# Patient Record
Sex: Female | Born: 1935 | Race: White | Hispanic: No | Marital: Single | Smoking: Never smoker
Health system: Southern US, Community
[De-identification: ages and names within clinical notes are randomized; demographics above are authoritative.]

## PROBLEM LIST (undated history)

## (undated) DIAGNOSIS — E785 Hyperlipidemia, unspecified: Secondary | ICD-10-CM

## (undated) DIAGNOSIS — Z9289 Personal history of other medical treatment: Secondary | ICD-10-CM

## (undated) DIAGNOSIS — K5790 Diverticulosis of intestine, part unspecified, without perforation or abscess without bleeding: Secondary | ICD-10-CM

## (undated) DIAGNOSIS — I10 Essential (primary) hypertension: Secondary | ICD-10-CM

## (undated) DIAGNOSIS — F09 Unspecified mental disorder due to known physiological condition: Secondary | ICD-10-CM

## (undated) HISTORY — DX: Personal history of other medical treatment: Z92.89

## (undated) HISTORY — DX: Hyperlipidemia, unspecified: E78.5

## (undated) HISTORY — PX: CATARACT EXTRACTION: SUR2

## (undated) HISTORY — DX: Diverticulosis of intestine, part unspecified, without perforation or abscess without bleeding: K57.90

## (undated) HISTORY — DX: Unspecified mental disorder due to known physiological condition: F09

## (undated) HISTORY — DX: Essential (primary) hypertension: I10

---

## 1999-11-28 HISTORY — PX: CARDIAC CATHETERIZATION: SHX172

## 2000-03-30 ENCOUNTER — Encounter: Admission: RE | Admit: 2000-03-30 | Discharge: 2000-03-30 | Payer: Self-pay | Admitting: Family Medicine

## 2000-03-30 ENCOUNTER — Encounter: Payer: Self-pay | Admitting: Family Medicine

## 2000-06-29 ENCOUNTER — Encounter: Admission: RE | Admit: 2000-06-29 | Discharge: 2000-06-29 | Payer: Self-pay | Admitting: Cardiology

## 2000-06-29 ENCOUNTER — Encounter: Payer: Self-pay | Admitting: Cardiology

## 2000-07-02 ENCOUNTER — Ambulatory Visit (HOSPITAL_COMMUNITY): Admission: RE | Admit: 2000-07-02 | Discharge: 2000-07-02 | Payer: Self-pay | Admitting: Cardiology

## 2001-03-01 ENCOUNTER — Other Ambulatory Visit: Admission: RE | Admit: 2001-03-01 | Discharge: 2001-03-01 | Payer: Self-pay | Admitting: Family Medicine

## 2001-12-16 ENCOUNTER — Encounter: Payer: Self-pay | Admitting: Orthopedic Surgery

## 2001-12-16 ENCOUNTER — Encounter: Admission: RE | Admit: 2001-12-16 | Discharge: 2001-12-16 | Payer: Self-pay | Admitting: Orthopedic Surgery

## 2001-12-17 ENCOUNTER — Encounter (INDEPENDENT_AMBULATORY_CARE_PROVIDER_SITE_OTHER): Payer: Self-pay | Admitting: Specialist

## 2001-12-17 ENCOUNTER — Ambulatory Visit (HOSPITAL_BASED_OUTPATIENT_CLINIC_OR_DEPARTMENT_OTHER): Admission: RE | Admit: 2001-12-17 | Discharge: 2001-12-17 | Payer: Self-pay | Admitting: Orthopedic Surgery

## 2003-02-26 ENCOUNTER — Ambulatory Visit (HOSPITAL_COMMUNITY): Admission: RE | Admit: 2003-02-26 | Discharge: 2003-02-26 | Payer: Self-pay | Admitting: Ophthalmology

## 2003-02-26 HISTORY — PX: OTHER SURGICAL HISTORY: SHX169

## 2003-06-28 HISTORY — PX: POLYPECTOMY: SHX149

## 2003-07-08 ENCOUNTER — Encounter (INDEPENDENT_AMBULATORY_CARE_PROVIDER_SITE_OTHER): Payer: Self-pay | Admitting: Specialist

## 2003-07-08 ENCOUNTER — Ambulatory Visit (HOSPITAL_COMMUNITY): Admission: RE | Admit: 2003-07-08 | Discharge: 2003-07-08 | Payer: Self-pay | Admitting: Gastroenterology

## 2003-07-08 LAB — HM COLONOSCOPY

## 2004-11-09 ENCOUNTER — Encounter: Admission: RE | Admit: 2004-11-09 | Discharge: 2004-11-09 | Payer: Self-pay | Admitting: Family Medicine

## 2005-01-13 ENCOUNTER — Encounter (INDEPENDENT_AMBULATORY_CARE_PROVIDER_SITE_OTHER): Payer: Self-pay | Admitting: *Deleted

## 2005-01-13 ENCOUNTER — Ambulatory Visit (HOSPITAL_COMMUNITY): Admission: RE | Admit: 2005-01-13 | Discharge: 2005-01-13 | Payer: Self-pay | Admitting: Obstetrics and Gynecology

## 2006-11-23 DIAGNOSIS — Z9289 Personal history of other medical treatment: Secondary | ICD-10-CM

## 2006-11-23 HISTORY — DX: Personal history of other medical treatment: Z92.89

## 2007-12-30 DIAGNOSIS — Z9289 Personal history of other medical treatment: Secondary | ICD-10-CM

## 2007-12-30 HISTORY — DX: Personal history of other medical treatment: Z92.89

## 2010-02-16 ENCOUNTER — Encounter: Admission: RE | Admit: 2010-02-16 | Discharge: 2010-05-17 | Payer: Self-pay | Admitting: Family Medicine

## 2011-04-14 NOTE — Op Note (Signed)
NAME:  Sabrina [name], Sabrina [name] NO.:  [id number]   MEDICAL RECORD NO.:  [id number]          PATIENT TYPE:  AMB   LOCATION:  SDC                           FACILITY:  [facility]   PHYSICIAN:  Randye Lobo, M.D.   DATE OF BIRTH:  [birth year]   DATE OF PROCEDURE:  01/13/2005  DATE OF DISCHARGE:                                 OPERATIVE REPORT   PREOPERATIVE DIAGNOSIS:  Endometrial polyp.   POSTOPERATIVE DIAGNOSIS:  Endometrial polyp.   PROCEDURE:  Hysteroscopy, dilatation and curettage, polypectomy of the  endometrium.   SURGEON:  Randye Lobo, M.D.   ANESTHESIA:  General endotracheal.   IV FLUIDS:  600 cc of lactated Ringer's.   ESTIMATED BLOOD LOSS:  Minimal.   URINE OUTPUT:  75 cc.   COMPLICATIONS:  None.   INDICATIONS FOR PROCEDURE:  The patient is a 77 year old gravida 2, para 2  Caucasian female who presented for evaluation of a thickened endometrium  noted on an MRI which was ordered for evaluation of the lower spine.  The  patient had further evaluation of this by Dr. Mosetta Putt who ordered a  pelvic ultrasound documenting an endometrial thickness of 15.5 mm.  The  myometrium and the ovaries were noted to be unremarkable.  The patient then  presented for further gynecologic evaluation and had an office endometrial  biopsy documenting polypoid benign endometrium.  The patient has had one  episode of postmenopausal bleeding, and she is not taking any hormone  therapy.   A discussion was held with the patient regarding the findings, and a  recommendation was made to proceed with a hysteroscopy with dilatation and  curettage and removal of the endometrial polyp.  The patient chose to  proceed after the risks, benefits, and alternatives were discussed with her.   FINDINGS:  Examination under anesthesia revealed a small anteverted uterus  with no adnexal masses.   Hysteroscopy demonstrated an endometrial polyp which measured approximately  2 cm in diameter.   It appeared to be very fluid-filled and almost cystic in  nature.  There was no evidence of any submucosal fibroids.   SPECIMENS:  The endometrial polyp and the endometrial curettings were sent  to pathology together.   DESCRIPTION OF PROCEDURE:  The patient was reidentified in the preoperative  holding area.  She received clindamycin 900 mg intravenously for antibiotic  prophylaxis.  The patient was taken to the operating room where general  endotracheal anesthesia was induced, and she was then placed in the dorsal  lithotomy position.  The vagina was then sterilely prepped and draped, and  the bladder was emptied of urine.   An examination under anesthesia was performed, and the findings are as noted  above.   A speculum was placed inside the vagina, and a single-tooth tenaculum was  placed on the anterior cervical lip.  The uterus was sounded to  approximately 7 cm.  The cervix was then sequentially dilated to a #19 Pratt  dilator.  The hysteroscope was then inserted into the uterine cavity under  the continuous infusion of Hiscon.  The findings  are as noted above.  The  hysteroscope was removed, and an attempt was made to remove the polyp with a  polyp forceps and then by curettage; however, this was unsuccessful.  The  cervix was then further dilated to a #25 Pratt dilator, and an attempt was  made to place the resectoscope through the cervix and into the endometrial  cavity.  There was some cervical stenosis that was encountered, and with  dilation of the patient's cervix, she developed bradycardia.  When the  dilation of the cervix was discontinued, the bradycardia would resolve.  A  decision was therefore made to again use a polyp forceps to attempt to  remove the endometrial polyp, and this was successful.  The diagnostic  hysteroscope was inserted one final time under continuous infusion of  sorbitol, and there was a small base of the polyp which was still remaining  along  the posterior mid-fundus.  The hysteroscope was removed, and a sharp  curettage in this area was performed.  This remaining small piece was  successfully removed.  All of the specimen was sent to pathology.  This  concluded the patient's hysteroscopy with D&C and polypectomy.   Hemostasis was good at the end of the procedure.  All instruments were  removed, and the patient was cleansed of any remaining Betadine.  There were  no complications to the procedure.  All needle, instrument, and sponge  counts were correct.   There was a sorbitol deficit of 110 cc.   The patient was then escorted to the recovery room in stable and awake  condition.      BES/MEDQ  D:  01/13/2005  T:  01/13/2005  Job:  [id number]   cc:   Mosetta Putt, M.D.  [address] [place]  [place] [address]  Fax: [phone]

## 2011-04-14 NOTE — Cardiovascular Report (Signed)
[facility]  Patient:    Sabrina [name], Sabrina [name]                       MRN: [id number] Proc. Date: 07/02/00 Adm. Date:  [id number] Attending:  Loreli Dollar CC:         Carolyne Fiscal, M.D.                        Cardiac Catheterization  INDICATIONS FOR PROCEDURE:  Ms. Melcher is a 77 year old female who has had frequent PVCs and nonsustained, asymptomatic, ventricular tachycardia.  She came to the office with her daughters who complained that their mother was so fatigued that she could not walk through the store without having to rest several times.  Ms. Bittinger denied any chest pain.  A nuclear study was performed that showed anterolateral ischemia.  PROCEDURES: 1. Left heart catheterization. 2. Selective right and left coronary arteriography. 3. Ventriculography x 2 in the RAO projection.  COMPLICATIONS:  None.  DESCRIPTION OF PROCEDURE:  The patient was prepped and draped in the usual sterile fashion exposing the right groin.  After applying local anesthetic with 1% Xylocaine, the Seldinger technique was deployed, and a #6 [place] introducer sheath was placed in the right femoral artery.  Selective right and left coronary arteriography and ventriculography in the RAO projection was performed.  With the first ventriculogram, the capsule was in the mitral apparatus.  It was moved to the apex of the heart for the second ventriculogram.  Then #6 French Judkins configuration catheters were used.  RESULTS: 1. Hemodynamic monitoring: The central aortic pressure was 172/72, and left    ventricular pressure was 172/22 with no aortic valve gradient noted at the    time of pullback. 2. Ventriculography: Ventriculography in the RAO projection revealed left    ventricular hypertrophy with a very heavily trabeculated left ventricle.    The posterior basilar segment was hyperdynamic, and there was a very large,    papillary muscle noted.  There was no  ______.  Ejection fraction was 65%,    and the end diastolic pressure was 22.  CORONARY ARTERIOGRAPHY:  There was calcification in the proximal LAD on fluoroscopy. 1. Left main: Normal. 2. LAD: The LAD extended down and across the apex of the heart.  It gave rise    to two small diagonal branches.  In the more distal portion of the mid LAD    was a long, tubular area of stenosis that was about 40 mm in length and    about 40-50% in stenosis.  The distal vessel was free of disease. 3. Circumflex: The circumflex gave rise to one large, ______ vessel.  This    system was free of disease. 4. Right coronary artery: The right coronary artery revealed a very large    vessel that had a large PDA and posterolateral branch, all of which were    free of disease.  CONCLUSIONS: 1. Mild coronary disease with 40% tubular narrowing and a long lesion in the    mid portion of the left anterior descending. 2. Hyperdynamic left ventricle with left ventricular hypertrophy.  DISCUSSION:  At this point, there is nothing that needs intervention.  She will be managed with medical therapy. DD:  07/02/00 TD:  07/02/00 Job: [id number]

## 2011-08-09 HISTORY — PX: TRANSTHORACIC ECHOCARDIOGRAM: SHX275

## 2011-10-17 LAB — HM PAP SMEAR: HM PAP: NORMAL

## 2013-06-30 ENCOUNTER — Encounter: Payer: Self-pay | Admitting: *Deleted

## 2013-07-03 ENCOUNTER — Ambulatory Visit (INDEPENDENT_AMBULATORY_CARE_PROVIDER_SITE_OTHER): Payer: PRIVATE HEALTH INSURANCE | Admitting: Internal Medicine

## 2013-07-03 ENCOUNTER — Encounter: Payer: Self-pay | Admitting: Internal Medicine

## 2013-07-03 VITALS — BP 152/58 | HR 67 | Ht 65.0 in | Wt 178.7 lb

## 2013-07-03 DIAGNOSIS — E119 Type 2 diabetes mellitus without complications: Secondary | ICD-10-CM

## 2013-07-03 DIAGNOSIS — E785 Hyperlipidemia, unspecified: Secondary | ICD-10-CM | POA: Insufficient documentation

## 2013-07-03 DIAGNOSIS — I493 Ventricular premature depolarization: Secondary | ICD-10-CM | POA: Insufficient documentation

## 2013-07-03 DIAGNOSIS — I10 Essential (primary) hypertension: Secondary | ICD-10-CM

## 2013-07-03 DIAGNOSIS — I4949 Other premature depolarization: Secondary | ICD-10-CM

## 2013-07-03 NOTE — Patient Instructions (Addendum)
Your physician wants you to follow-up in:  6 months. You will receive a reminder letter in the mail two months in advance. If you don't receive a letter, please call our office to schedule the follow-up appointment.   

## 2013-07-03 NOTE — Progress Notes (Signed)
OFFICE NOTE  Chief Complaint:  Routine followup  Primary Care Physician: Ailene Ravel, MD  HPI:  Sabrina [name] is a pleasant 79 year old female. She has had a history of asymptomatic nonsustained ventricular tachycardia. She has had a cardiac catheterization performed in 2001 that showed only LVH. She had a nuclear study in 2007 that was negative for ischemia. She continues to have no awareness of any arrhythmias.  She is unlimited from a physical standpoint. She has no ankle edema, no unusual breathlessness. She is diabetic and has a history of dyslipidemia and hypertension. Just has mild carotid artery disease. She had carotid Dopplers in 2009 that showed less than 50% narrowing on the left and none on the right. She had been seen by Dr. Adella Hare at the [facility] in [facility]. The concern with her family history of Alzheimer's is that she may be at risk for this. She has basically been on Namenda and Aricept for this. She does have some memory issues but according to her this is more preventative. I think she probably has some early issues with dementia. Otherwise she has no other complaints.  PMHx:  Past Medical History  Diagnosis Date  . Hypertension   . Diabetes mellitus without complication   . Hyperlipidemia   . PVC's (premature ventricular contractions)   . Cognitive disorder     early dementia?  . Diverticular disease     L colon  . History of nuclear stress test 11/23/2006    dipyridamole; normal pattern of perfusion; low risk scan   . History of Doppler ultrasound 12/30/2007    carotid doppler; L Mid ICA 0-49% diameter reduction, R & L subclavian artery 0-49% diameter reduction     Past Surgical History  Procedure Laterality Date  . Cardiac catheterization  2001    non occlusive disease, 40% stenosis of LAD; LVH  . Polypectomy  06/2003  . Eyelid surgery  02/26/2003    r/t dematochalsis of upper eyelids  . Transthoracic echocardiogram  08/09/2011   EF>55%; mild concentric LVH; LA mildly dilated; mild MV prolapse; trace MR, mild TR, trace AV regurg, trace PV regurg  . Cataract extraction  04/2013, 05/2013    x2    FAMHx:  Family History  Problem Relation Age of Onset  . Hypertension Mother   . Stroke Mother   . Heart disease Father   . Heart Problems Brother     MV prolapse  . Heart Problems Sister     MV prolapse  . Hypertension Sister     x2    SOCHx:   reports that she has never smoked. She has never used smokeless tobacco. She reports that she does not drink alcohol or use illicit drugs.  ALLERGIES:  Allergies  Allergen Reactions  . Codeine   . Penicillins     ROS: A comprehensive review of systems was negative except for: Cardiovascular: positive for palpitations Neurological: positive for memory problems  HOME MEDS: Current Outpatient Prescriptions  Medication Sig Dispense Refill  . Alendronate Sodium (FOSAMAX PO) Take by mouth once a week.      . ALPRAZolam (XANAX) 0.5 MG tablet Take 0.5 mg by mouth at bedtime as needed for sleep.      . Ascorbic Acid (VITAMIN C PO) Take by mouth daily.      Marland Kitchen aspirin 81 MG EC tablet Take 81 mg by mouth daily. Swallow whole.      . benazepril (LOTENSIN) 40 MG tablet Take 40 mg by mouth  daily.      . calcium carbonate (OS-CAL) 600 MG TABS tablet Take 600 mg by mouth 2 (two) times daily with a meal.      . Cyanocobalamin (VITAMIN B-12 PO) Take by mouth daily.      Marland Kitchen diltiazem (TIAZAC) 240 MG 24 hr capsule Take 240 mg by mouth daily.      Marland Kitchen donepezil (ARICEPT) 10 MG tablet Take 10 mg by mouth at bedtime.      Marland Kitchen doxazosin (CARDURA) 2 MG tablet Take 2 mg by mouth at bedtime.      . DUREZOL 0.05 % EMUL 2 (two) times daily.      . fish oil-omega-3 fatty acids 1000 MG capsule Take 4 g by mouth daily.      Marland Kitchen glipiZIDE (GLUCOTROL XL) 2.5 MG 24 hr tablet Take 1 tablet by mouth daily.      . hydrochlorothiazide (MICROZIDE) 12.5 MG capsule Take 12.5 mg by mouth daily.      . memantine  (NAMENDA) 10 MG tablet Take 15 mg by mouth daily.      . metoprolol succinate (TOPROL-XL) 25 MG 24 hr tablet Take 25 mg by mouth daily.      . pravastatin (PRAVACHOL) 20 MG tablet Take 20 mg by mouth daily.      Marland Kitchen PROLENSA 0.07 % SOLN daily.      . vitamin E (VITAMIN E) 400 UNIT capsule Take 400 Units by mouth daily.       No current facility-administered medications for this visit.    LABS/IMAGING: No results found for this or any previous visit (from the past 48 hour(s)). No results found.  VITALS: BP 152/58  Pulse 67  Ht 5\' 5"  (1.651 m)  Wt 178 lb 11.2 oz (81.058 kg)  BMI 29.74 kg/m2  EXAM: General appearance: alert and no distress Neck: no adenopathy, no carotid bruit, no JVD, supple, symmetrical, trachea midline and thyroid not enlarged, symmetric, no tenderness/mass/nodules Lungs: clear to auscultation bilaterally Heart: regular rate and rhythm, S1, S2 normal, no murmur, click, rub or gallop Abdomen: soft, non-tender; bowel sounds normal; no masses,  no organomegaly Extremities: extremities normal, atraumatic, no cyanosis or edema Pulses: 2+ and symmetric Skin: Skin color, texture, turgor normal. No rashes or lesions Neurologic: Grossly normal  EKG: Sinus rhythm with first degree AV block  ASSESSMENT: 1. Hypertension 2. Asymptomatic PVCs 3. Hyperlipidemia 4. Diabetes 5. Cognitive  PLAN: 1.  Sabrina [name] is doing very well. I recommend continuing her current medications we'll see her back annually or sooner as necessary.  [name], MD, [facility] Attending Cardiologist [facility]  Darcel Frane C 07/03/2013, 7:57 PM

## 2013-11-05 ENCOUNTER — Other Ambulatory Visit: Payer: Self-pay | Admitting: Internal Medicine

## 2013-11-05 NOTE — Telephone Encounter (Signed)
Rx was sent to pharmacy electronically. 

## 2013-12-05 ENCOUNTER — Other Ambulatory Visit: Payer: Self-pay | Admitting: Internal Medicine

## 2013-12-05 NOTE — Telephone Encounter (Signed)
Rx was sent to pharmacy electronically. 

## 2014-01-01 DIAGNOSIS — F0280 Dementia in other diseases classified elsewhere without behavioral disturbance: Secondary | ICD-10-CM | POA: Insufficient documentation

## 2014-01-01 DIAGNOSIS — G309 Alzheimer's disease, unspecified: Secondary | ICD-10-CM

## 2014-01-09 ENCOUNTER — Ambulatory Visit (INDEPENDENT_AMBULATORY_CARE_PROVIDER_SITE_OTHER): Payer: PRIVATE HEALTH INSURANCE | Admitting: Internal Medicine

## 2014-01-09 ENCOUNTER — Encounter: Payer: Self-pay | Admitting: Internal Medicine

## 2014-01-09 VITALS — BP 136/58 | HR 76 | Ht 66.0 in | Wt 177.9 lb

## 2014-01-09 DIAGNOSIS — I4949 Other premature depolarization: Secondary | ICD-10-CM

## 2014-01-09 DIAGNOSIS — I493 Ventricular premature depolarization: Secondary | ICD-10-CM

## 2014-01-09 DIAGNOSIS — E785 Hyperlipidemia, unspecified: Secondary | ICD-10-CM

## 2014-01-09 DIAGNOSIS — E119 Type 2 diabetes mellitus without complications: Secondary | ICD-10-CM

## 2014-01-09 DIAGNOSIS — I10 Essential (primary) hypertension: Secondary | ICD-10-CM

## 2014-01-09 MED ORDER — METOPROLOL SUCCINATE ER 25 MG PO TB24: 25.0000 mg | ORAL_TABLET | Freq: Every day | ORAL | Status: DC

## 2014-01-09 MED ORDER — DILTIAZEM HCL ER BEADS 240 MG PO CP24: 240.0000 mg | ORAL_CAPSULE | Freq: Every day | ORAL | Status: DC

## 2014-01-09 MED ORDER — BENAZEPRIL HCL 40 MG PO TABS: ORAL_TABLET | ORAL | Status: DC

## 2014-01-09 MED ORDER — HYDROCHLOROTHIAZIDE 12.5 MG PO CAPS: 12.5000 mg | ORAL_CAPSULE | Freq: Every day | ORAL | Status: DC

## 2014-01-09 NOTE — Patient Instructions (Signed)
Follow up in 1 year.
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
## 2014-06-09 LAB — BASIC METABOLIC PANEL
BUN: 23 mg/dL — AB (ref 4–21)
Creatinine: 0.8 mg/dL (ref <=1.1)
Glucose: 132 mg/dL
Potassium: 4.1 mmol/L (ref 3.4–5.3)
Sodium: 142 mmol/L (ref 137–147)

## 2014-06-09 LAB — LIPID PANEL
CHOLESTEROL: 128 mg/dL (ref 0–200)
HDL: 45 mg/dL (ref 35–70)
LDL CALC: 56 mg/dL
TRIGLYCERIDES: 133 mg/dL (ref 40–160)

## 2014-06-09 LAB — VITAMIN B12: VITAMIN B12: 839

## 2014-06-09 LAB — HEPATIC FUNCTION PANEL
ALK PHOS: 36 U/L (ref 25–125)
ALT: 20 U/L (ref 7–35)
AST: 28 U/L (ref 13–35)
Bilirubin, Total: 0.4 mg/dL

## 2014-06-09 LAB — HEMOGLOBIN A1C: HEMOGLOBIN A1C: 7

## 2014-07-13 LAB — HM DEXA SCAN

## 2014-08-10 ENCOUNTER — Other Ambulatory Visit: Payer: Self-pay

## 2014-08-10 ENCOUNTER — Other Ambulatory Visit: Payer: Self-pay | Admitting: Internal Medicine

## 2014-08-10 MED ORDER — DOXAZOSIN MESYLATE 2 MG PO TABS: 2.0000 mg | ORAL_TABLET | Freq: Every day | ORAL | Status: DC

## 2015-01-11 ENCOUNTER — Ambulatory Visit: Payer: PRIVATE HEALTH INSURANCE | Admitting: Internal Medicine

## 2015-01-12 ENCOUNTER — Encounter: Payer: Self-pay | Admitting: Internal Medicine

## 2015-01-12 ENCOUNTER — Ambulatory Visit (INDEPENDENT_AMBULATORY_CARE_PROVIDER_SITE_OTHER): Payer: Medicare Other | Admitting: Internal Medicine

## 2015-01-12 VITALS — BP 180/60 | HR 76 | Ht 65.0 in | Wt 178.6 lb

## 2015-01-12 DIAGNOSIS — I493 Ventricular premature depolarization: Secondary | ICD-10-CM

## 2015-01-12 DIAGNOSIS — E785 Hyperlipidemia, unspecified: Secondary | ICD-10-CM

## 2015-01-12 DIAGNOSIS — I872 Venous insufficiency (chronic) (peripheral): Secondary | ICD-10-CM

## 2015-01-12 DIAGNOSIS — I10 Essential (primary) hypertension: Secondary | ICD-10-CM

## 2015-01-12 NOTE — Patient Instructions (Addendum)
Your physician wants you to follow-up in: 1 year with Dr. Rennis GoldenHilty. You will receive a reminder letter in the mail two months in advance. If you don't receive a letter, please call our office to schedule the follow-up appointment.  Dr. Rennis GoldenHilty recommends getting compression stockings - 20-3030mmHg - for your leg leg >> [facility] in [place]

## 2015-01-12 NOTE — Progress Notes (Signed)
OFFICE NOTE  Chief Complaint:  Routine followup  Primary Care Physician: Ailene RavelHAMRICK,MAURA L, MD  HPI:  Sabrina Dowsenne [name] [name] is a pleasant 81 year old female. She has had a history of asymptomatic nonsustained ventricular tachycardia. She has had a cardiac catheterization performed in 2001 that showed only LVH. She had a nuclear study in 2007 that was negative for ischemia. She continues to have no awareness of any arrhythmias.  She is unlimited from a physical standpoint. She has no ankle edema, no unusual breathlessness. She is diabetic and has a history of dyslipidemia and hypertension. Just has mild carotid artery disease. She had carotid Dopplers in 2009 that showed less than 50% narrowing on the left and none on the right. She had been seen by Dr. Adella HareApplegate at [facility]. The concern with her family history of Alzheimer's is that she may be at risk for this. She has basically been on Namenda and Aricept for this. She does have some memory issues but according to her this is more preventative. I think she probably has some early issues with dementia. Otherwise she has no other complaints. He occasionally has some swelling of the left leg, but he injured that in the past.  I saw Sabrina [name] today in the office. She is doing well and is not bothered by PVCs. There is small amount of swelling in her left ankle. She's concerned about venous or arterial insufficiency. She reports her memory is pretty stable and saw our neurologist recently he was pleased with out she was doing.  PMHx:  Past Medical History  Diagnosis Date  . Hypertension   . Diabetes mellitus without complication   . Hyperlipidemia   . PVC's (premature ventricular contractions)   . Cognitive disorder     early dementia?  . Diverticular disease     L colon  . History of nuclear stress test 11/23/2006    dipyridamole; normal pattern of perfusion; low risk scan   . History of Doppler ultrasound  12/30/2007    carotid doppler; L Mid ICA 0-49% diameter reduction, R & L subclavian artery 0-49% diameter reduction     Past Surgical History  Procedure Laterality Date  . Cardiac catheterization  2001    non occlusive disease, 40% stenosis of LAD; LVH  . Polypectomy  06/2003  . Eyelid surgery  02/26/2003    r/t dematochalsis of upper eyelids  . Transthoracic echocardiogram  08/09/2011    EF>55%; mild concentric LVH; LA mildly dilated; mild MV prolapse; trace MR, mild TR, trace AV regurg, trace PV regurg  . Cataract extraction  04/2013, 05/2013    x2    FAMHx:  Family History  Problem Relation Age of Onset  . Hypertension Mother   . Stroke Mother   . Heart disease Father   . Heart Problems Brother     MV prolapse  . Heart Problems Sister     MV prolapse  . Hypertension Sister     x2    SOCHx:   reports that she has never smoked. She has never used smokeless tobacco. She reports that she does not drink alcohol or use illicit drugs.  ALLERGIES:  Allergies  Allergen Reactions  . Codeine   . Penicillins     ROS: A comprehensive review of systems was negative except for: Cardiovascular: positive for lower extremity edema Neurological: positive for memory problems  HOME MEDS: Current Outpatient Prescriptions  Medication Sig Dispense Refill  . acetaminophen (TYLENOL) 650 MG CR  tablet 650 mg as needed.    . Alendronate Sodium (FOSAMAX PO) Take by mouth once a week.    . ALPRAZolam (XANAX) 0.5 MG tablet Take 0.5 mg by mouth at bedtime as needed for sleep.    . Ascorbic Acid (VITAMIN [name] PO) Take by mouth daily.    Marland Kitchen aspirin 81 MG EC tablet Take 81 mg by mouth daily. Swallow whole.    . benazepril (LOTENSIN) 40 MG tablet TAKE 1 TABLET BY MOUTH DAILY. 90 tablet 3  . calcium carbonate (OS-CAL) 600 MG TABS tablet Take 600 mg by mouth 2 (two) times daily with a meal.    . Cyanocobalamin (VITAMIN B-12 PO) Take by mouth daily.    Marland Kitchen diltiazem (TIAZAC) 240 MG 24 hr capsule Take 1 capsule  (240 mg total) by mouth daily. 90 capsule 3  . donepezil (ARICEPT) 10 MG tablet Take 10 mg by mouth at bedtime.    Marland Kitchen doxazosin (CARDURA) 2 MG tablet Take 1 tablet (2 mg total) by mouth daily. 90 tablet 1  . fish oil-omega-3 fatty acids 1000 MG capsule Take 4 g by mouth daily.    Marland Kitchen glipiZIDE (GLUCOTROL XL) 2.5 MG 24 hr tablet Take 1 tablet by mouth daily.    . hydrochlorothiazide (MICROZIDE) 12.5 MG capsule Take 1 capsule (12.5 mg total) by mouth daily. 90 capsule 3  . memantine (NAMENDA) 10 MG tablet Take 15 mg by mouth daily.    . metoprolol succinate (TOPROL-XL) 25 MG 24 hr tablet Take 1 tablet (25 mg total) by mouth daily. 90 tablet 3  . pravastatin (PRAVACHOL) 20 MG tablet Take 20 mg by mouth daily.    . vitamin E (VITAMIN E) 400 UNIT capsule Take 400 Units by mouth daily.     No current facility-administered medications for this visit.    LABS/IMAGING: No results found for this or any previous visit (from the past 48 hour(s)). No results found.  VITALS: BP 180/60 mmHg  Pulse 76  Ht  (1.651 m)  Wt 178 lb 9.6 oz (81.012 kg)  BMI 29.72 kg/m2  EXAM: General appearance: alert and no distress Neck: no adenopathy, no carotid bruit, no JVD, supple, symmetrical, trachea midline and thyroid not enlarged, symmetric, no tenderness/mass/nodules Lungs: clear to auscultation bilaterally Heart: regular rate and rhythm, S1, S2 normal, no murmur, click, rub or gallop Abdomen: soft, non-tender; bowel sounds normal; no masses,  no organomegaly Extremities: edema 1+ left lower extremity edema Pulses: 2+ and symmetric Skin: Skin color, texture, turgor normal. No rashes or lesions Neurologic: Grossly normal  EKG: Sinus rhythm with first degree AV block and PVCs at 76  ASSESSMENT: 1. Hypertension 2. Asymptomatic PVCs 3. Hyperlipidemia 4. Diabetes Cognitive impairment Venous insufficiency  PLAN: 1.  Sabrina [name] is doing very well. She is asymptomatic with her PVCs. Her blood  pressure is well-controlled. Her cholesterol and diabetes are managed by Dr. Nathanial Rancher and I am told they're within normal limits. She says that her memory has been fairly stable and continues to follow with Dr. Adella Hare in [facility]. There is a small amount of venous insufficiency in the left ankle. I recommended a compression stocking the left leg. There is no significant varicose veins and she is generally not symptomatically with this. Plan to see her back annually or sooner as necessary. We will refill her medications today with a year supply of refills.  [name], MD, [facility] Attending Cardiologist [facility]  Sabrina [name] 01/12/2015, 11:28  AM

## 2015-01-14 ENCOUNTER — Other Ambulatory Visit: Payer: Self-pay | Admitting: Internal Medicine

## 2015-01-14 NOTE — Telephone Encounter (Signed)
Rx(s) sent to pharmacy electronically.  

## 2015-02-01 ENCOUNTER — Other Ambulatory Visit: Payer: Self-pay | Admitting: Internal Medicine

## 2015-02-01 NOTE — Telephone Encounter (Signed)
Rx(s) sent to pharmacy electronically.  

## 2015-02-19 ENCOUNTER — Telehealth: Payer: Self-pay | Admitting: Internal Medicine

## 2015-02-19 NOTE — Telephone Encounter (Signed)
Advised no D or DM, if questionable check w/ pharmacy - pt voiced understanding.

## 2015-02-19 NOTE — Telephone Encounter (Signed)
Pt wants to know what she can take  For hayfever and allergy,with the medicine she is on.

## 2015-05-13 LAB — HM DIABETES EYE EXAM

## 2015-07-30 ENCOUNTER — Encounter: Payer: Self-pay | Admitting: Internal Medicine

## 2015-11-01 ENCOUNTER — Other Ambulatory Visit: Payer: Self-pay | Admitting: Internal Medicine

## 2015-11-01 NOTE — Telephone Encounter (Signed)
Rx request sent to pharmacy.  

## 2015-12-16 ENCOUNTER — Other Ambulatory Visit: Payer: Self-pay | Admitting: Internal Medicine

## 2015-12-16 NOTE — Telephone Encounter (Signed)
Rx request sent to pharmacy.  

## 2016-01-12 ENCOUNTER — Ambulatory Visit (INDEPENDENT_AMBULATORY_CARE_PROVIDER_SITE_OTHER): Payer: Medicare Other | Admitting: Internal Medicine

## 2016-01-12 ENCOUNTER — Encounter: Payer: Self-pay | Admitting: Internal Medicine

## 2016-01-12 VITALS — BP 128/48 | HR 85 | Ht 64.5 in | Wt 171.0 lb

## 2016-01-12 DIAGNOSIS — I493 Ventricular premature depolarization: Secondary | ICD-10-CM | POA: Diagnosis not present

## 2016-01-12 DIAGNOSIS — R0609 Other forms of dyspnea: Secondary | ICD-10-CM | POA: Diagnosis not present

## 2016-01-12 DIAGNOSIS — I447 Left bundle-branch block, unspecified: Secondary | ICD-10-CM | POA: Diagnosis not present

## 2016-01-12 NOTE — Patient Instructions (Signed)
Your physician has requested that you have a lexiscan myoview. For further information please visit [email or web address] Please follow instruction sheet, as given.  Your physician recommends that you continue on your current medications as directed. Please refer to the Current Medication list given to you today.  Your physician wants you to follow-up in: 1 year with Dr. Rennis Golden. You will receive a reminder letter in the mail two months in advance. If you don't receive a letter, please call our office to schedule the follow-up appointment.

## 2016-01-12 NOTE — Progress Notes (Signed)
OFFICE NOTE  Chief Complaint:  Routine followup  Primary Care Physician: Ailene Ravel, MD  HPI:  Sabrina [name] is a pleasant 82 year old female. She has had a history of asymptomatic nonsustained ventricular tachycardia. She has had a cardiac catheterization performed in 2001 that showed only LVH. She had a nuclear study in 2007 that was negative for ischemia. She continues to have no awareness of any arrhythmias.  She is unlimited from a physical standpoint. She has no ankle edema, no unusual breathlessness. She is diabetic and has a history of dyslipidemia and hypertension. Just has mild carotid artery disease. She had carotid Dopplers in 2009 that showed less than 50% narrowing on the left and none on the right. She had been seen by Dr. Adella Hare at the [facility] in [facility]. The concern with her family history of Alzheimer's is that she may be at risk for this. She has basically been on Namenda and Aricept for this. She does have some memory issues but according to her this is more preventative. I think she probably has some early issues with dementia. Otherwise she has no other complaints. He occasionally has some swelling of the left leg, but he injured that in the past.  I saw Sabrina [name] today in the office. She is doing well and is not bothered by PVCs. There is small amount of swelling in her left ankle. She's concerned about venous or arterial insufficiency. She reports her memory is pretty stable and saw our neurologist recently he was pleased with out she was doing.  Sabrina [name] returns today for follow-up. She denies any chest pain but has had some worsening shortness of breath. Her EKG today however shows a new left bundle branch block. She previously had a mild interventricular conduction delay. She certainly has risk factors for coronary artery disease and her last stress test was about 8 years ago. She's also had PVCs for which she is not particularly  bothered. She's been under a lot of stress recently with her husband has been in and out of the hospital.  PMHx:  Past Medical History  Diagnosis Date  . Hypertension   . Diabetes mellitus without complication (HCC)   . Hyperlipidemia   . PVC's (premature ventricular contractions)   . Cognitive disorder     early dementia?  . Diverticular disease     L colon  . History of nuclear stress test 11/23/2006    dipyridamole; normal pattern of perfusion; low risk scan   . History of Doppler ultrasound 12/30/2007    carotid doppler; L Mid ICA 0-49% diameter reduction, R & L subclavian artery 0-49% diameter reduction     Past Surgical History  Procedure Laterality Date  . Cardiac catheterization  2001    non occlusive disease, 40% stenosis of LAD; LVH  . Polypectomy  06/2003  . Eyelid surgery  02/26/2003    r/t dematochalsis of upper eyelids  . Transthoracic echocardiogram  08/09/2011    EF>55%; mild concentric LVH; LA mildly dilated; mild MV prolapse; trace MR, mild TR, trace AV regurg, trace PV regurg  . Cataract extraction  04/2013, 05/2013    x2    FAMHx:  Family History  Problem Relation Age of Onset  . Hypertension Mother   . Stroke Mother   . Heart disease Father   . Heart Problems Brother     MV prolapse  . Heart Problems Sister     MV prolapse  . Hypertension Sister  x2    SOCHx:   reports that she has never smoked. She has never used smokeless tobacco. She reports that she does not drink alcohol or use illicit drugs.  ALLERGIES:  Allergies  Allergen Reactions  . Codeine   . Penicillins     ROS: A comprehensive review of systems was negative except for: Cardiovascular: positive for lower extremity edema Neurological: positive for memory problems  HOME MEDS: Current Outpatient Prescriptions  Medication Sig Dispense Refill  . acetaminophen (TYLENOL) 650 MG CR tablet 650 mg as needed.    . Alendronate Sodium (FOSAMAX PO) Take by mouth once a week.    .  ALPRAZolam (XANAX) 0.5 MG tablet Take 0.5 mg by mouth at bedtime as needed for sleep.    . Ascorbic Acid (VITAMIN C PO) Take by mouth daily.    Marland Kitchen aspirin 81 MG EC tablet Take 81 mg by mouth daily. Swallow whole.    . benazepril (LOTENSIN) 40 MG tablet TAKE 1 TABLET BY MOUTH DAILY. 90 tablet 0  . calcium carbonate (OS-CAL) 600 MG TABS tablet Take 600 mg by mouth 2 (two) times daily with a meal.    . Cyanocobalamin (VITAMIN B-12 PO) Take by mouth daily.    Marland Kitchen diltiazem (CARDIZEM CD) 240 MG 24 hr capsule TAKE 1 CAPSULE BY MOUTH DAILY. 90 capsule 0  . donepezil (ARICEPT) 10 MG tablet Take 10 mg by mouth at bedtime.    Marland Kitchen doxazosin (CARDURA) 2 MG tablet Take 1 tablet (2 mg total) by mouth daily. 90 tablet 3  . glipiZIDE (GLUCOTROL XL) 2.5 MG 24 hr tablet Take 1 tablet by mouth daily.    . hydrochlorothiazide (MICROZIDE) 12.5 MG capsule TAKE 1 CAPSULE BY MOUTH DAILY. 90 capsule 0  . memantine (NAMENDA) 10 MG tablet Take 1/2 tab (5mg ) by mouth each morning and 1 tab (10mg ) at night    . metoprolol succinate (TOPROL-XL) 25 MG 24 hr tablet TAKE 1 TABLET BY MOUTH DAILY. 90 tablet 0  . Omega-3 Fatty Acids (FISH OIL) 1000 MG CAPS Take 3 capsules by mouth daily.    . pravastatin (PRAVACHOL) 20 MG tablet Take 20 mg by mouth daily.    . vitamin E (VITAMIN E) 400 UNIT capsule Take 400 Units by mouth daily.     No current facility-administered medications for this visit.    LABS/IMAGING: No results found for this or any previous visit (from the past 48 hour(s)). No results found.  VITALS: BP 128/48 mmHg  Pulse 85  Ht 5' 4.5" (1.638 m)  Wt 171 lb (77.565 kg)  BMI 28.91 kg/m2  EXAM: General appearance: alert and no distress Neck: no adenopathy, no carotid bruit, no JVD, supple, symmetrical, trachea midline and thyroid not enlarged, symmetric, no tenderness/mass/nodules Lungs: clear to auscultation bilaterally Heart: regular rate and rhythm, S1, S2 normal, no murmur, click, rub or gallop Abdomen: soft,  non-tender; bowel sounds normal; no masses,  no organomegaly Extremities: edema 1+ left lower extremity edema Pulses: 2+ and symmetric Skin: Skin color, texture, turgor normal. No rashes or lesions Neurologic: Grossly normal  EKG: Sinus rhythm with first-degree AV block and left bundle branch block at 85  ASSESSMENT: 1. New left bundle branch block 2. Hypertension 3. Asymptomatic PVCs 4. Hyperlipidemia 5. Diabetes 6. Cognitive impairment 7. Venous insufficiency  PLAN: 1.  Sabrina [name] has a new left bundle branch block on EKG. She's had some worsening shortness of breath on exertion. This could be due to intraventricular dyssynchrony or perhaps coronary disease  which may have led to her bundle branch block. I like to get a Lexiscan nuclear stress test to rule out ischemia. She's not had a stress test in over 8 years. She continues to have asymptomatic PVCs which could be ischemic in origin.  Plan follow-up after a stress test.  [name], MD, [facility] Attending Cardiologist [facility]  Lisette Abu [facility] 01/12/2016, 2:04 PM

## 2016-01-20 ENCOUNTER — Telehealth (HOSPITAL_COMMUNITY): Payer: Self-pay

## 2016-01-20 NOTE — Telephone Encounter (Signed)
Encounter complete. 

## 2016-01-25 ENCOUNTER — Ambulatory Visit (HOSPITAL_COMMUNITY)
Admission: RE | Admit: 2016-01-25 | Discharge: 2016-01-25 | Disposition: A | Discharge status: Home / Self Care | Payer: Medicare Other | Source: Ambulatory Visit | Attending: Urology | Admitting: Urology

## 2016-01-25 DIAGNOSIS — I779 Disorder of arteries and arterioles, unspecified: Secondary | ICD-10-CM | POA: Diagnosis not present

## 2016-01-25 DIAGNOSIS — R0609 Other forms of dyspnea: Secondary | ICD-10-CM | POA: Insufficient documentation

## 2016-01-25 DIAGNOSIS — R5383 Other fatigue: Secondary | ICD-10-CM | POA: Diagnosis not present

## 2016-01-25 DIAGNOSIS — Z6828 Body mass index (BMI) 28.0-28.9, adult: Secondary | ICD-10-CM | POA: Insufficient documentation

## 2016-01-25 DIAGNOSIS — I447 Left bundle-branch block, unspecified: Secondary | ICD-10-CM | POA: Insufficient documentation

## 2016-01-25 DIAGNOSIS — Z8249 Family history of ischemic heart disease and other diseases of the circulatory system: Secondary | ICD-10-CM | POA: Insufficient documentation

## 2016-01-25 DIAGNOSIS — E119 Type 2 diabetes mellitus without complications: Secondary | ICD-10-CM | POA: Diagnosis not present

## 2016-01-25 DIAGNOSIS — I10 Essential (primary) hypertension: Secondary | ICD-10-CM | POA: Diagnosis not present

## 2016-01-25 DIAGNOSIS — R0602 Shortness of breath: Secondary | ICD-10-CM | POA: Diagnosis not present

## 2016-01-25 DIAGNOSIS — E663 Overweight: Secondary | ICD-10-CM | POA: Insufficient documentation

## 2016-01-25 DIAGNOSIS — R002 Palpitations: Secondary | ICD-10-CM | POA: Insufficient documentation

## 2016-01-25 LAB — MYOCARDIAL PERFUSION IMAGING
CHL CUP NUCLEAR SDS: 3
CSEPPHR: 60 {beats}/min
LV dias vol: 165 mL
LVSYSVOL: 73 mL
Rest HR: 57 {beats}/min
SRS: 2
SSS: 5
TID: 1.02

## 2016-01-25 MED ORDER — TECHNETIUM TC 99M SESTAMIBI GENERIC - CARDIOLITE
10.9000 | Freq: Once | INTRAVENOUS | Status: AC | PRN
  Administered 2016-01-25: 10.9 via INTRAVENOUS

## 2016-01-25 MED ORDER — REGADENOSON 0.4 MG/5ML IV SOLN
0.4000 mg | Freq: Once | INTRAVENOUS | Status: AC
  Administered 2016-01-25: 0.4 mg via INTRAVENOUS

## 2016-01-25 MED ORDER — AMINOPHYLLINE 25 MG/ML IV SOLN
75.0000 mg | Freq: Once | INTRAVENOUS | Status: AC
  Administered 2016-01-25: 75 mg via INTRAVENOUS

## 2016-01-25 MED ORDER — TECHNETIUM TC 99M SESTAMIBI GENERIC - CARDIOLITE
31.2000 | Freq: Once | INTRAVENOUS | Status: AC | PRN
  Administered 2016-01-25: 31.2 via INTRAVENOUS

## 2016-02-21 ENCOUNTER — Other Ambulatory Visit: Payer: Self-pay | Admitting: Internal Medicine

## 2016-02-21 NOTE — Telephone Encounter (Signed)
Rx request sent to pharmacy.  

## 2016-06-29 ENCOUNTER — Other Ambulatory Visit: Payer: Self-pay | Admitting: Internal Medicine

## 2016-06-30 NOTE — Telephone Encounter (Signed)
Rx(s) sent to pharmacy electronically.  

## 2016-07-06 ENCOUNTER — Other Ambulatory Visit: Payer: Self-pay | Admitting: Internal Medicine

## 2016-07-06 NOTE — Telephone Encounter (Signed)
Rx sent to pharmacy   

## 2016-07-29 ENCOUNTER — Other Ambulatory Visit: Payer: Self-pay | Admitting: Internal Medicine

## 2016-08-01 NOTE — Telephone Encounter (Signed)
Rx(s) sent to pharmacy electronically.  

## 2016-08-04 ENCOUNTER — Ambulatory Visit (INDEPENDENT_AMBULATORY_CARE_PROVIDER_SITE_OTHER): Payer: Medicare Other | Admitting: Family Medicine

## 2016-08-04 ENCOUNTER — Encounter: Payer: Self-pay | Admitting: Family Medicine

## 2016-08-04 VITALS — BP 155/73 | HR 76 | Ht 64.5 in | Wt 170.9 lb

## 2016-08-04 DIAGNOSIS — I10 Essential (primary) hypertension: Secondary | ICD-10-CM | POA: Diagnosis not present

## 2016-08-04 DIAGNOSIS — E1100 Type 2 diabetes mellitus with hyperosmolarity without nonketotic hyperglycemic-hyperosmolar coma (NKHHC): Secondary | ICD-10-CM

## 2016-08-04 DIAGNOSIS — G308 Other Alzheimer's disease: Secondary | ICD-10-CM

## 2016-08-04 DIAGNOSIS — E538 Deficiency of other specified B group vitamins: Secondary | ICD-10-CM

## 2016-08-04 DIAGNOSIS — E785 Hyperlipidemia, unspecified: Secondary | ICD-10-CM | POA: Diagnosis not present

## 2016-08-04 DIAGNOSIS — M810 Age-related osteoporosis without current pathological fracture: Secondary | ICD-10-CM

## 2016-08-04 DIAGNOSIS — R457 State of emotional shock and stress, unspecified: Secondary | ICD-10-CM

## 2016-08-04 DIAGNOSIS — E559 Vitamin D deficiency, unspecified: Secondary | ICD-10-CM

## 2016-08-04 DIAGNOSIS — E663 Overweight: Secondary | ICD-10-CM

## 2016-08-04 DIAGNOSIS — G4700 Insomnia, unspecified: Secondary | ICD-10-CM

## 2016-08-04 DIAGNOSIS — F0280 Dementia in other diseases classified elsewhere without behavioral disturbance: Secondary | ICD-10-CM

## 2016-08-04 MED ORDER — TRAZODONE HCL 50 MG PO TABS: ORAL_TABLET | ORAL | 0 refills | Status: DC
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
## 2016-08-04 NOTE — Patient Instructions (Signed)
Goal is to walk 6 days per week   Management of Memory Problems  There are some general things you can do to help manage your memory problems.  Your memory may not in fact recover, but by using techniques and strategies you will be able to manage your memory difficulties better.  1)  Establish a routine.  Try to establish and then stick to a regular routine.  By doing this, you will get used to what to expect and you will reduce the need to rely on your memory.  Also, try to do things at the same time of day, such as taking your medication or checking your calendar first thing in the morning.  Think about think that you can do as a part of a regular routine and make a list.  Then enter them into a daily planner to remind you.  This will help you establish a routine.  2)  Organize your environment.  Organize your environment so that it is uncluttered.  Decrease visual stimulation.  Place everyday items such as keys or cell phone in the same place every day (ie.  Basket next to front door)  Use post it notes with a brief message to yourself (ie. Turn off light, lock the door)  Use labels to indicate where things go (ie. Which cupboards are for food, dishes, etc.)  Keep a notepad and pen by the telephone to take messages  3)  Memory Aids  A diary or journal/notebook/daily planner  Making a list (shopping list, chore list, to do list that needs to be done)  Using an alarm as a reminder (kitchen timer or cell phone alarm)  Using cell phone to store information (Notes, Calendar, Reminders)  Calendar/White board placed in a prominent position  Post-it notes  In order for memory aids to be useful, you need to have good habits.  It's no good remembering to make a note in your journal if you don't remember to look in it.  Try setting aside a certain time of day to look in journal.  4)  Improving mood and managing fatigue.  There may be other factors that contribute to memory  difficulties.  Factors, such as anxiety, depression and tiredness can affect memory.  Regular gentle exercise can help improve your mood and give you more energy.  Simple relaxation techniques may help relieve symptoms of anxiety  Try to get back to completing activities or hobbies you enjoyed doing in the past.  Learn to pace yourself through activities to decrease fatigue.  Find out about some local support groups where you can share experiences with others. Try and achieve 7-8 hours of sleep at night.Insomnia Insomnia is a sleep disorder that makes it difficult to fall asleep or to stay asleep. Insomnia can cause tiredness (fatigue), low energy, difficulty concentrating, mood swings, and poor performance at work or school.  There are three different ways to classify insomnia:  Difficulty falling asleep.  Difficulty staying asleep.  Waking up too early in the morning. Any type of insomnia can be long-term (chronic) or short-term (acute). Both are common. Short-term insomnia usually lasts for three months or less. Chronic insomnia occurs at least three times a week for longer than three months. CAUSES  Insomnia may be caused by another condition, situation, or substance, such as:  Anxiety.  Certain medicines.  Gastroesophageal reflux disease (GERD) or other gastrointestinal conditions.  Asthma or other breathing conditions.  Restless legs syndrome, sleep apnea, or other sleep disorders.  Chronic pain.  Menopause. This may include hot flashes.  Stroke.  Abuse of alcohol, tobacco, or illegal drugs.  Depression.  Caffeine.   Neurological disorders, such as Alzheimer disease.  An overactive thyroid (hyperthyroidism). The cause of insomnia may not be known. RISK FACTORS Risk factors for insomnia include:  Gender. Women are more commonly affected than men.  Age. Insomnia is more common as you get older.  Stress. This may involve your professional or personal  life.  Income. Insomnia is more common in people with lower income.  Lack of exercise.   Irregular work schedule or night shifts.  Traveling between different time zones. SIGNS AND SYMPTOMS If you have insomnia, trouble falling asleep or trouble staying asleep is the main symptom. This may lead to other symptoms, such as:  Feeling fatigued.  Feeling nervous about going to sleep.  Not feeling rested in the morning.  Having trouble concentrating.  Feeling irritable, anxious, or depressed. TREATMENT  Treatment for insomnia depends on the cause. If your insomnia is caused by an underlying condition, treatment will focus on addressing the condition. Treatment may also include:   Medicines to help you sleep.  Counseling or therapy.  Lifestyle adjustments. HOME CARE INSTRUCTIONS   Take medicines only as directed by your health care provider.  Keep regular sleeping and waking hours. Avoid naps.  Keep a sleep diary to help you and your health care provider figure out what could be causing your insomnia. Include:   When you sleep.  When you wake up during the night.  How well you sleep.   How rested you feel the next day.  Any side effects of medicines you are taking.  What you eat and drink.   Make your bedroom a comfortable place where it is easy to fall asleep:  Put up shades or special blackout curtains to block light from outside.  Use a white noise machine to block noise.  Keep the temperature cool.   Exercise regularly as directed by your health care provider. Avoid exercising right before bedtime.  Use relaxation techniques to manage stress. Ask your health care provider to suggest some techniques that may work well for you. These may include:  Breathing exercises.  Routines to release muscle tension.  Visualizing peaceful scenes.  Cut back on alcohol, caffeinated beverages, and cigarettes, especially close to bedtime. These can disrupt your  sleep.  Do not overeat or eat spicy foods right before bedtime. This can lead to digestive discomfort that can make it hard for you to sleep.  Limit screen use before bedtime. This includes:  Watching TV.  Using your smartphone, tablet, and computer.  Stick to a routine. This can help you fall asleep faster. Try to do a quiet activity, brush your teeth, and go to bed at the same time each night.  Get out of bed if you are still awake after 15 minutes of trying to sleep. Keep the lights down, but try reading or doing a quiet activity. When you feel sleepy, go back to bed.  Make sure that you drive carefully. Avoid driving if you feel very sleepy.  Keep all follow-up appointments as directed by your health care provider. This is important. SEEK MEDICAL CARE IF:   You are tired throughout the day or have trouble in your daily routine due to sleepiness.  You continue to have sleep problems or your sleep problems get worse. SEEK IMMEDIATE MEDICAL CARE IF:   You have serious thoughts about hurting yourself or  someone else.   This information is not intended to replace advice given to you by your health care provider. Make sure you discuss any questions you have with your health care provider.   Document Released: 11/10/2000 Document Revised: 08/04/2015 Document Reviewed: 08/14/2014 Elsevier Interactive Patient Education [employer]2016 Elsevier Inc.

## 2016-08-05 ENCOUNTER — Encounter: Payer: Self-pay | Admitting: Family Medicine

## 2016-08-05 DIAGNOSIS — E663 Overweight: Secondary | ICD-10-CM | POA: Insufficient documentation

## 2016-08-05 DIAGNOSIS — M810 Age-related osteoporosis without current pathological fracture: Secondary | ICD-10-CM | POA: Insufficient documentation

## 2016-08-05 DIAGNOSIS — E538 Deficiency of other specified B group vitamins: Secondary | ICD-10-CM | POA: Insufficient documentation

## 2016-08-05 DIAGNOSIS — E559 Vitamin D deficiency, unspecified: Secondary | ICD-10-CM | POA: Insufficient documentation

## 2016-08-05 DIAGNOSIS — K5790 Diverticulosis of intestine, part unspecified, without perforation or abscess without bleeding: Secondary | ICD-10-CM | POA: Insufficient documentation

## 2016-08-05 DIAGNOSIS — G4700 Insomnia, unspecified: Secondary | ICD-10-CM | POA: Insufficient documentation

## 2016-08-05 DIAGNOSIS — R457 State of emotional shock and stress, unspecified: Secondary | ICD-10-CM | POA: Insufficient documentation

## 2016-08-05 NOTE — Assessment & Plan Note (Signed)
Sees Dr. Royston SinnerJoseph Miller- neurologist

## 2016-08-05 NOTE — Assessment & Plan Note (Signed)
Diagnosis 2010

## 2016-08-05 NOTE — Assessment & Plan Note (Signed)
Dr. Hilty-cardiology 

## 2016-08-05 NOTE — Assessment & Plan Note (Signed)
Diagnosis 2005

## 2016-08-05 NOTE — Assessment & Plan Note (Signed)
Dr. Hilty-cardiology
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
## 2016-08-17 ENCOUNTER — Telehealth: Payer: Self-pay

## 2016-08-17 NOTE — Telephone Encounter (Signed)
Yes she can just stop it.  Tell her I am sorry it caused that side effect.   She can take melatonin 5 mg tablets of the self dissolving ones. 1-3 tablets as needed nightly.   Take 20-30 minutes prior to sleep onset.

## 2016-08-17 NOTE — Telephone Encounter (Signed)
Pt states that trazodone is causing "crazy dreams", headaches and diarrhea.  Pt states that she wakes up during the night and then it takes 2 hours for her to go back to sleep.  Pt would like to know if she can stop this medication.  Tiajuana Amass. Nelson, CMA

## 2016-08-17 NOTE — Telephone Encounter (Signed)
Left message advising of recommendations.  

## 2016-09-01 ENCOUNTER — Ambulatory Visit (INDEPENDENT_AMBULATORY_CARE_PROVIDER_SITE_OTHER): Payer: Medicare Other | Admitting: Family Medicine

## 2016-09-01 ENCOUNTER — Encounter: Payer: Self-pay | Admitting: Family Medicine

## 2016-09-01 VITALS — BP 130/67 | HR 73 | Temp 97.6°F | Ht 64.5 in | Wt 167.8 lb

## 2016-09-01 DIAGNOSIS — Z23 Encounter for immunization: Secondary | ICD-10-CM

## 2016-09-01 DIAGNOSIS — E1100 Type 2 diabetes mellitus with hyperosmolarity without nonketotic hyperglycemic-hyperosmolar coma (NKHHC): Secondary | ICD-10-CM

## 2016-09-01 DIAGNOSIS — F5101 Primary insomnia: Secondary | ICD-10-CM

## 2016-09-01 DIAGNOSIS — I10 Essential (primary) hypertension: Secondary | ICD-10-CM

## 2016-09-01 DIAGNOSIS — E663 Overweight: Secondary | ICD-10-CM

## 2016-09-01 DIAGNOSIS — R457 State of emotional shock and stress, unspecified: Secondary | ICD-10-CM

## 2016-09-01 NOTE — Progress Notes (Signed)
Impression and Recommendations:    1. Primary insomnia   2. Essential hypertension   3. Type 2 diabetes mellitus with hyperosmolarity without coma, without long-term current use of insulin (HCC)   4. Emotional stress   5. Need for prophylactic vaccination and inoculation against influenza   6. Overweight (BMI 25.0-29.9)     Stop trazodone since not tolerating well  Counseling re: insomnia and handouts provided  htn- well controlled. Cont meds  Repeat A1c q 3 mo; counseled  Emotionally- stable  Flu given  Wt loss recommended/ d/c pt  Education and routine counseling performed. Handouts provided.   New Prescriptions   No medications on file    Modified Medications   No medications on file    Discontinued Medications   TRAZODONE (DESYREL) 50 MG TABLET    1/2- 1 tablet nightly for sleep    Return in about 5 weeks (around 10/06/2016) for Follow-up of current medical issues, a1c.  Please see AVS handed out to patient at the end of our visit for further patient instructions/ counseling done pertaining to today's office visit.    Note: This document was prepared using Dragon voice recognition software and may include unintentional dictation errors.     Subjective:    Chief Complaint  Patient presents with  . Hypertension    HPI: Sabrina [name] is a 82 y.o. female who presents to [facility] today for follow up for insomnia/ starting new med.  We were hoping to ween her off the Xanax and started trazodone last OV.    Sleep:  Pt did not tolerate the trazodone well. Made her feel weird- didn't like how it made her feel so she stopped it.     HTN: Home BP readings have been running in the 139/56, stable.   Pt has been tolerating meds well.  Taking as prescribed.  Denies HA, dizziness, CP, SOB, Visual changes, increasing pedal edema.    DM: A1c 6.6 at Dr [employer] office Aug 17th, 2017- tol meds well.   BP- 120/50 that day as  well.    Wt Readings from Last 3 Encounters:  09/01/16 167 lb 12.8 oz (76.1 kg)  08/04/16 170 lb 14.4 oz (77.5 kg)  01/25/16 171 lb (77.6 kg)    BP Readings from Last 3 Encounters:  09/01/16 130/67  08/04/16 (!) 155/73  01/12/16 (!) 128/48    Pulse Readings from Last 3 Encounters:  09/01/16 73  08/04/16 76  01/12/16 85    BMI Readings from Last 3 Encounters:  09/01/16 28.36 kg/m  08/04/16 28.88 kg/m  01/25/16 28.46 kg/m     Lab Results  Component Value Date   CREATININE 0.8 06/09/2014   BUN 23 (A) 06/09/2014   NA 142 06/09/2014   K 4.1 06/09/2014    Lab Results  Component Value Date   CHOL 128 06/09/2014    Lab Results  Component Value Date   HDL 45 06/09/2014    Lab Results  Component Value Date   LDLCALC 56 06/09/2014    Lab Results  Component Value Date   TRIG 133 06/09/2014    No results found for: CHOLHDL  No results found for: LDLDIRECT ===================================================================  Patient Active Problem List   Diagnosis Date Noted  . Emotional stress 08/05/2016    Priority: High  . Insomnia 08/05/2016    Priority: High  . HTN (hypertension) 07/03/2013    Priority: High  . Hyperlipidemia 07/03/2013  Priority: High  . DM2 (diabetes mellitus, type 2) - diet controlled 07/03/2013    Priority: High  . Overweight (BMI 25.0-29.9) 08/05/2016    Priority: Medium  . Osteoporosis 08/05/2016    Priority: Medium  . Alzheimer's disease 01/01/2014    Priority: Medium  . Diverticular disease 08/05/2016  . Vitamin D deficiency 08/05/2016  . Vitamin B12 deficiency without anemia 08/05/2016  . LBBB (left bundle branch block) 01/12/2016  . DOE (dyspnea on exertion) 01/12/2016  . Venous insufficiency of left leg 01/12/2015  . PVC (premature ventricular contraction) 07/03/2013    Past Medical History:  Diagnosis Date  . Cognitive disorder    early dementia?  . Diverticular disease    L colon  . History of  Doppler ultrasound 12/30/2007   carotid doppler; L Mid ICA 0-49% diameter reduction, R & L subclavian artery 0-49% diameter reduction   . History of nuclear stress test 11/23/2006   dipyridamole; normal pattern of perfusion; low risk scan   . Hyperlipidemia   . Hypertension     Past Surgical History:  Procedure Laterality Date  . CARDIAC CATHETERIZATION  2001   non occlusive disease, 40% stenosis of LAD; LVH  . CATARACT EXTRACTION  04/2013, 05/2013   x2  . eyelid surgery  02/26/2003   r/t dematochalsis of upper eyelids  . POLYPECTOMY  06/2003  . TRANSTHORACIC ECHOCARDIOGRAM  08/09/2011   EF>55%; mild concentric LVH; LA mildly dilated; mild MV prolapse; trace MR, mild TR, trace AV regurg, trace PV regurg    Family History  Problem Relation Age of Onset  . Hypertension Mother   . Stroke Mother   . Heart disease Father   . Diabetes Brother   . Cancer Sister   . Heart Problems Brother     MV prolapse  . Heart Problems Sister     MV prolapse  . Hypertension Sister     x2    History  Drug Use No  ,  History  Alcohol Use No  ,  History  Smoking Status  . Never Smoker  Smokeless Tobacco  . Never Used  ,    Current Outpatient Prescriptions on File Prior to Visit  Medication Sig Dispense Refill  . acetaminophen (TYLENOL) 650 MG CR tablet 650 mg as needed.    . Alendronate Sodium (FOSAMAX PO) Take by mouth once a week.    . Ascorbic Acid (VITAMIN C PO) Take by mouth daily.    Marland Kitchen. aspirin 81 MG EC tablet Take 81 mg by mouth daily. Swallow whole.    . benazepril (LOTENSIN) 40 MG tablet TAKE 1 TABLET BY MOUTH DAILY 90 tablet 2  . calcium carbonate (OS-CAL) 600 MG TABS tablet Take 600 mg by mouth daily with breakfast.     . Cyanocobalamin (VITAMIN B-12 PO) Take by mouth daily.    Marland Kitchen. diltiazem (CARDIZEM CD) 240 MG 24 hr capsule TAKE 1 CAPSULE BY MOUTH DAILY. 90 capsule 2  . donepezil (ARICEPT) 10 MG tablet Take 10 mg by mouth at bedtime.    Marland Kitchen. doxazosin (CARDURA) 2 MG tablet TAKE 1  TABLET BY MOUTH DAILY. 90 tablet 3  . glipiZIDE (GLUCOTROL XL) 2.5 MG 24 hr tablet Take 1 tablet by mouth daily.    . hydrochlorothiazide (MICROZIDE) 12.5 MG capsule TAKE 1 CAPSULE BY MOUTH DAILY. 90 capsule 2  . memantine (NAMENDA) 10 MG tablet Take 10 mg by mouth 2 (two) times daily. 1/2 TABLET QAM AND ONE TABLET QHS    . metoprolol  succinate (TOPROL-XL) 25 MG 24 hr tablet TAKE 1 TABLET BY MOUTH DAILY. 90 tablet 2  . Omega-3 Fatty Acids (FISH OIL) 1000 MG CAPS Take 3 capsules by mouth daily.    . pravastatin (PRAVACHOL) 20 MG tablet Take 20 mg by mouth daily.    . vitamin E (VITAMIN E) 400 UNIT capsule Take 400 Units by mouth daily.     No current facility-administered medications on file prior to visit.     Allergies  Allergen Reactions  . Codeine   . Penicillins      Review of Systems  Constitutional: Negative.  Negative for chills, diaphoresis, fever, malaise/fatigue and weight loss.  HENT: Negative.  Negative for congestion, sore throat and tinnitus.   Eyes: Negative.  Negative for blurred vision, double vision and photophobia.  Respiratory: Negative.  Negative for cough and wheezing.   Cardiovascular: Negative.  Negative for chest pain and palpitations.  Gastrointestinal: Negative.  Negative for blood in stool, diarrhea, nausea and vomiting.  Genitourinary: Negative.  Negative for dysuria, frequency and urgency.  Musculoskeletal: Negative.  Negative for joint pain and myalgias.  Skin: Negative.  Negative for itching and rash.  Neurological: Negative.  Negative for dizziness, focal weakness, weakness and headaches.  Endo/Heme/Allergies: Negative.  Negative for environmental allergies and polydipsia. Does not bruise/bleed easily.  Psychiatric/Behavioral: Negative.  Negative for depression and memory loss. The patient is not nervous/anxious and does not have insomnia.     Objective:    Blood pressure 130/67, pulse 73, temperature 97.6 F (36.4 C), temperature source Oral,  height 5' 4.5" (1.638 m), weight 167 lb 12.8 oz (76.1 kg).  Body mass index is 28.36 kg/m.  General: Well Developed, well nourished, and in no acute distress.  HEENT: Normocephalic, atraumatic, pupils equal round reactive to light, neck supple, No carotid bruits, no JVD Skin: Warm and dry, cap RF less 2 sec Cardiac: Regular rate and rhythm, S1, S2 WNL's, no murmurs rubs or gallops Respiratory: ECTA B/L, Not using accessory muscles, speaking in full sentences. NeuroM-Sk: Ambulates w/o assistance, moves ext * 4 w/o difficulty, sensation grossly intact.  Ext: mild edema b/l lower ext Psych: No HI/SI, judgement and insight good, Euthymic mood. Full Affect.

## 2016-09-01 NOTE — Patient Instructions (Signed)
If you have insomnia or difficulty sleeping, this information is for you:  - Avoid caffeinated beverages after lunch,  no alcoholic beverages,  no eating within 2-3 hours of lying down,  avoid exposure to blue light before bed,  avoid daytime naps, and  needs to maintain a regular sleep schedule- go to sleep and wake up around the same time every night.   - Resolve concerns or worries before entering bedroom:  Discussed relaxation techniques with patient and to keep a journal to write down fears\ worries.  I suggested seeing a counselor for CBT.   - Recommend patient meditate or do deep breathing exercises to help relax.   Incorporate the use of white noise machines or listen to "sleep meditation music", or recordings of guided meditations for sleep from YouTube which are free, such as  "guided meditation for detachment from over thinking"  by Michael Sealey.     Insomnia Insomnia is a sleep disorder that makes it difficult to fall asleep or to stay asleep. Insomnia can cause tiredness (fatigue), low energy, difficulty concentrating, mood swings, and poor performance at work or school.  There are three different ways to classify insomnia:  Difficulty falling asleep.  Difficulty staying asleep.  Waking up too early in the morning. Any type of insomnia can be long-term (chronic) or short-term (acute). Both are common. Short-term insomnia usually lasts for three months or less. Chronic insomnia occurs at least three times a week for longer than three months. CAUSES  Insomnia may be caused by another condition, situation, or substance, such as:  Anxiety.  Certain medicines.  Gastroesophageal reflux disease (GERD) or other gastrointestinal conditions.  Asthma or other breathing conditions.  Restless legs syndrome, sleep apnea, or other sleep disorders.  Chronic pain.  Menopause. This may include hot flashes.  Stroke.  Abuse of alcohol, tobacco, or illegal  drugs.  Depression.  Caffeine.   Neurological disorders, such as Alzheimer disease.  An overactive thyroid (hyperthyroidism). The cause of insomnia may not be known. RISK FACTORS Risk factors for insomnia include:  Gender. Women are more commonly affected than men.  Age. Insomnia is more common as you get older.  Stress. This may involve your professional or personal life.  Income. Insomnia is more common in people with lower income.  Lack of exercise.   Irregular work schedule or night shifts.  Traveling between different time zones. SIGNS AND SYMPTOMS If you have insomnia, trouble falling asleep or trouble staying asleep is the main symptom. This may lead to other symptoms, such as:  Feeling fatigued.  Feeling nervous about going to sleep.  Not feeling rested in the morning.  Having trouble concentrating.  Feeling irritable, anxious, or depressed. TREATMENT  Treatment for insomnia depends on the cause. If your insomnia is caused by an underlying condition, treatment will focus on addressing the condition. Treatment may also include:   Medicines to help you sleep.  Counseling or therapy.  Lifestyle adjustments. HOME CARE INSTRUCTIONS   Take medicines only as directed by your health care provider.  Keep regular sleeping and waking hours. Avoid naps.  Keep a sleep diary to help you and your health care provider figure out what could be causing your insomnia. Include:   When you sleep.  When you wake up during the night.  How well you sleep.   How rested you feel the next day.  Any side effects of medicines you are taking.  What you eat and drink.   Make your bedroom a   comfortable place where it is easy to fall asleep:  Put up shades or special blackout curtains to block light from outside.  Use a white noise machine to block noise.  Keep the temperature cool.   Exercise regularly as directed by your health care provider. Avoid exercising  right before bedtime.  Use relaxation techniques to manage stress. Ask your health care provider to suggest some techniques that may work well for you. These may include:  Breathing exercises.  Routines to release muscle tension.  Visualizing peaceful scenes.  Cut back on alcohol, caffeinated beverages, and cigarettes, especially close to bedtime. These can disrupt your sleep.  Do not overeat or eat spicy foods right before bedtime. This can lead to digestive discomfort that can make it hard for you to sleep.  Limit screen use before bedtime. This includes:  Watching TV.  Using your smartphone, tablet, and computer.  Stick to a routine. This can help you fall asleep faster. Try to do a quiet activity, brush your teeth, and go to bed at the same time each night.  Get out of bed if you are still awake after 15 minutes of trying to sleep. Keep the lights down, but try reading or doing a quiet activity. When you feel sleepy, go back to bed.  Make sure that you drive carefully. Avoid driving if you feel very sleepy.  Keep all follow-up appointments as directed by your health care provider. This is important. SEEK MEDICAL CARE IF:   You are tired throughout the day or have trouble in your daily routine due to sleepiness.  You continue to have sleep problems or your sleep problems get worse. SEEK IMMEDIATE MEDICAL CARE IF:   You have serious thoughts about hurting yourself or someone else.   This information is not intended to replace advice given to you by your health care provider. Make sure you discuss any questions you have with your health care provider.   Document Released: 11/10/2000 Document Revised: 08/04/2015 Document Reviewed: 08/14/2014 Elsevier Interactive Patient Education 2016 [employer]  

## 2016-09-18 ENCOUNTER — Encounter: Payer: Self-pay | Admitting: Family Medicine

## 2016-09-18 ENCOUNTER — Ambulatory Visit (INDEPENDENT_AMBULATORY_CARE_PROVIDER_SITE_OTHER): Payer: Medicare Other | Admitting: Family Medicine

## 2016-09-18 VITALS — BP 128/71 | HR 47 | Temp 98.2°F | Ht 64.5 in | Wt 168.4 lb

## 2016-09-18 DIAGNOSIS — M545 Low back pain, unspecified: Secondary | ICD-10-CM

## 2016-09-18 DIAGNOSIS — M791 Myalgia: Secondary | ICD-10-CM

## 2016-09-18 DIAGNOSIS — M7918 Myalgia, other site: Secondary | ICD-10-CM

## 2016-09-18 NOTE — Patient Instructions (Signed)

## 2016-09-18 NOTE — Progress Notes (Signed)
Impression and Recommendations:    1. Musculoskeletal pain   2. Acute midline low back pain without sciatica    Reassurance provided on likelihood of musculoskeletal etiology. - Counseled patient on pathophysiology of disease and discussed various treatment options, in addition to discussing the risks and benefits of various medications.  Tylenol when necessary in future if it comes back. - Anticipatory guidance given.  - Encouraged to return to clinic or call the office with any further questions or concerns.  No problem-specific Assessment & Plan notes found for this encounter.   New Prescriptions   No medications on file    Modified Medications   No medications on file    Discontinued Medications   No medications on file    The patient was counseled, risk factors were discussed, anticipatory guidance given.  Gross side effects, risk and benefits, and alternatives of medications and treatment plan in general discussed with patient.  Patient is aware that all medications have potential side effects and we are unable to predict every side effect or drug-drug interaction that may occur.   Patient will call with any questions prior to using medication if they have concerns.  Expresses verbal understanding and consents to current therapy and treatment regimen.  No barriers to understanding were identified.  Red flag symptoms and signs discussed in detail.  Patient expressed understanding regarding what to do in case of emergency\urgent symptoms  Return if symptoms worsen or fail to improve, for Follow-up of current medical issues as discussed prior.  Please see AVS handed out to patient at the end of our visit for further patient instructions/ counseling done pertaining to today's office visit.    Note: This document was prepared using Dragon voice recognition software and may include unintentional dictation  errors.   --------------------------------------------------------------------------------------------------------------------------------------------------------------------------------------------------------------------------------------------    Subjective:    CC:  Chief Complaint  Patient presents with  . Abdominal Pain    HPI: Sabrina [name] is a 82 y.o. female who presents to [facility] at [facility] today for issues as discussed below.  Wednesday last week- after having 4 very busy days around the house doing housework and yard work she started having left lower side abd pain and bilateral lower back pain without radiculopathy. It only hurt when she moved or twisted her body. There was no nausea, vomiting, diarrhea, urinary symptoms, tolerating food well, did not get better or worse with food, only with movement is what made it worse.  This lasted a couple days and she rested and it improved. Her pain is now gone.   NO F/C, Bowel movements N.   back pain-made worse with bending, improved with rest.  Tried-nothing except for Tylenol. Patient states she felt silly coming in now that her pain was gone but she wanted to get checked out just in case.    Wt Readings from Last 3 Encounters:  09/18/16 168 lb 6.4 oz (76.4 kg)  09/01/16 167 lb 12.8 oz (76.1 kg)  08/04/16 170 lb 14.4 oz (77.5 kg)   BP Readings from Last 3 Encounters:  09/18/16 128/71  09/01/16 130/67  08/04/16 (!) 155/73   Pulse Readings from Last 3 Encounters:  09/18/16 (!) 47  09/01/16 73  08/04/16 76   BMI Readings from Last 3 Encounters:  09/18/16 28.46 kg/m  09/01/16 28.36 kg/m  08/04/16 28.88 kg/m     Patient Care Team    Relationship Specialty Notifications Start End  Thomasene Lot, DO  PCP - General Family Medicine  08/04/16   Chrystie NoseKenneth C Hilty, MD Consulting Physician Cardiology  08/04/16   Sheran Luzichard Ramos, MD Consulting Physician Physical Medicine and Rehabilitation  08/04/16   Venancio PoissonLaura  Lomax, MD Consulting Physician Dermatology  08/04/16   Linus SalmonsJoseph K Miller, MD Consulting Physician Neurology  08/05/16    Comment: [phone]     Patient Active Problem List   Diagnosis Date Noted  . Emotional stress 08/05/2016    Priority: High  . Insomnia 08/05/2016    Priority: High  . HTN (hypertension) 07/03/2013    Priority: High  . Hyperlipidemia 07/03/2013    Priority: High  . DM2 (diabetes mellitus, type 2) - diet controlled 07/03/2013    Priority: High  . Overweight (BMI 25.0-29.9) 08/05/2016    Priority: Medium  . Osteoporosis 08/05/2016    Priority: Medium  . Alzheimer's disease 01/01/2014    Priority: Medium  . Diverticular disease 08/05/2016  . Vitamin D deficiency 08/05/2016  . Vitamin B12 deficiency without anemia 08/05/2016  . LBBB (left bundle branch block) 01/12/2016  . DOE (dyspnea on exertion) 01/12/2016  . Venous insufficiency of left leg 01/12/2015  . PVC (premature ventricular contraction) 07/03/2013    Past Medical history, Surgical history, Family history, Social history, Allergies and Medications have been entered into the medical record, reviewed and changed as needed.   Allergies:  Allergies  Allergen Reactions  . Codeine   . Penicillins     Review of Systems  Constitutional: Negative.  Negative for chills, diaphoresis, fever, malaise/fatigue and weight loss.  HENT: Negative.  Negative for congestion, sore throat and tinnitus.   Eyes: Negative.  Negative for blurred vision, double vision and photophobia.  Respiratory: Negative.  Negative for cough and wheezing.   Cardiovascular: Negative.  Negative for chest pain and palpitations.  Gastrointestinal: Positive for abdominal pain. Negative for blood in stool, diarrhea, nausea and vomiting.       Pt states that the pain lasted 5 days but completely resolved yesterday.  Genitourinary: Negative.  Negative for dysuria, frequency and urgency.  Musculoskeletal: Negative.  Negative for joint pain and  myalgias.  Skin: Negative.  Negative for itching and rash.  Neurological: Negative.  Negative for dizziness, focal weakness, weakness and headaches.  Endo/Heme/Allergies: Negative.  Negative for environmental allergies and polydipsia. Does not bruise/bleed easily.  Psychiatric/Behavioral: Negative.  Negative for depression and memory loss. The patient is not nervous/anxious and does not have insomnia.      Objective:   Blood pressure 128/71, pulse (!) 47, temperature 98.2 F (36.8 C), temperature source Oral, height 5' 4.5" (1.638 m), weight 168 lb 6.4 oz (76.4 kg). Body mass index is 28.46 kg/m. General: Well Developed, well nourished, appropriate for stated age.  Neuro: Alert and oriented x3, extra-ocular muscles intact, sensation grossly intact.  HEENT: Normocephalic, atraumatic, neck supple   Skin: Warm and dry, no gross rash. Cardiac: RRR, S1 S2,  no murmurs rubs or gallops.  Respiratory: ECTA B/L, Not using accessory muscles, speaking in full sentences-unlabored. Abdomen:  Positive bowel sounds 4 no guarding rigidity rebound, no organomegaly, no suprapubic tenderness, no flank pain Back:  Some paravertebral muscular tightness appreciated in the thoracolumbar region. No bony tenderness over spinous processes. Range of motion essentially normal. L5, S1 intact bilaterally. Vascular:  No gross lower ext edema, cap RF less 2 sec. Psych: No HI/SI, judgement and insight good, Euthymic mood. Full Affect.

## 2016-10-06 ENCOUNTER — Other Ambulatory Visit: Payer: Self-pay

## 2016-10-06 MED ORDER — GLIPIZIDE ER 2.5 MG PO TB24: 2.5000 mg | ORAL_TABLET | Freq: Every day | ORAL | 0 refills | Status: DC

## 2016-10-13 ENCOUNTER — Ambulatory Visit: Payer: Medicare Other | Admitting: Family Medicine

## 2016-11-03 ENCOUNTER — Encounter: Payer: Self-pay | Admitting: Family Medicine

## 2016-11-03 ENCOUNTER — Ambulatory Visit (INDEPENDENT_AMBULATORY_CARE_PROVIDER_SITE_OTHER): Payer: Medicare Other | Admitting: Family Medicine

## 2016-11-03 VITALS — BP 145/58 | HR 64 | Ht 64.5 in | Wt 170.6 lb

## 2016-11-03 DIAGNOSIS — I10 Essential (primary) hypertension: Secondary | ICD-10-CM

## 2016-11-03 DIAGNOSIS — R457 State of emotional shock and stress, unspecified: Secondary | ICD-10-CM

## 2016-11-03 DIAGNOSIS — E663 Overweight: Secondary | ICD-10-CM

## 2016-11-03 DIAGNOSIS — F5101 Primary insomnia: Secondary | ICD-10-CM

## 2016-11-03 DIAGNOSIS — G308 Other Alzheimer's disease: Secondary | ICD-10-CM

## 2016-11-03 DIAGNOSIS — M810 Age-related osteoporosis without current pathological fracture: Secondary | ICD-10-CM

## 2016-11-03 DIAGNOSIS — G309 Alzheimer's disease, unspecified: Secondary | ICD-10-CM

## 2016-11-03 DIAGNOSIS — F0281 Dementia in other diseases classified elsewhere with behavioral disturbance: Secondary | ICD-10-CM

## 2016-11-03 DIAGNOSIS — E1169 Type 2 diabetes mellitus with other specified complication: Secondary | ICD-10-CM | POA: Diagnosis not present

## 2016-11-03 DIAGNOSIS — E1100 Type 2 diabetes mellitus with hyperosmolarity without nonketotic hyperglycemic-hyperosmolar coma (NKHHC): Secondary | ICD-10-CM

## 2016-11-03 DIAGNOSIS — E782 Mixed hyperlipidemia: Secondary | ICD-10-CM

## 2016-11-03 LAB — POCT GLYCOSYLATED HEMOGLOBIN (HGB A1C): Hemoglobin A1C: 6.6

## 2016-11-03 NOTE — Patient Instructions (Signed)
DASH Eating Plan DASH stands for "Dietary Approaches to Stop Hypertension." The DASH eating plan is a healthy eating plan that has been shown to reduce high blood pressure (hypertension). Additional health benefits may include reducing the risk of type 2 diabetes mellitus, heart disease, and stroke. The DASH eating plan may also help with weight loss. What do I need to know about the DASH eating plan? For the DASH eating plan, you will follow these general guidelines:  Choose foods with less than 150 milligrams of sodium per serving (as listed on the food label).  Use salt-free seasonings or herbs instead of table salt or sea salt.  Check with your health care provider or pharmacist before using salt substitutes.  Eat lower-sodium products. These are often labeled as "low-sodium" or "no salt added."  Eat fresh foods. Avoid eating a lot of canned foods.  Eat more vegetables, fruits, and low-fat dairy products.  Choose whole grains. Look for the word "whole" as the first word in the ingredient list.  Choose fish and skinless chicken or turkey more often than red meat. Limit fish, poultry, and meat to 6 oz (170 g) each day.  Limit sweets, desserts, sugars, and sugary drinks.  Choose heart-healthy fats.  Eat more home-cooked food and less restaurant, buffet, and fast food.  Limit fried foods.  Do not fry foods. Cook foods using methods such as baking, boiling, grilling, and broiling instead.  When eating at a restaurant, ask that your food be prepared with less salt, or no salt if possible. What foods can I eat? Seek help from a dietitian for individual calorie needs. Grains  Whole grain or whole wheat bread. Brown rice. Whole grain or whole wheat pasta. Quinoa, bulgur, and whole grain cereals. Low-sodium cereals. Corn or whole wheat flour tortillas. Whole grain cornbread. Whole grain crackers. Low-sodium crackers. Vegetables  Fresh or frozen vegetables (raw, steamed, roasted, or  grilled). Low-sodium or reduced-sodium tomato and vegetable juices. Low-sodium or reduced-sodium tomato sauce and paste. Low-sodium or reduced-sodium canned vegetables. Fruits  All fresh, canned (in natural juice), or frozen fruits. Meat and Other Protein Products  Ground beef (85% or leaner), grass-fed beef, or beef trimmed of fat. Skinless chicken or turkey. Ground chicken or turkey. Pork trimmed of fat. All fish and seafood. Eggs. Dried beans, peas, or lentils. Unsalted nuts and seeds. Unsalted canned beans. Dairy  Low-fat dairy products, such as skim or 1% milk, 2% or reduced-fat cheeses, low-fat ricotta or cottage cheese, or plain low-fat yogurt. Low-sodium or reduced-sodium cheeses. Fats and Oils  Tub margarines without trans fats. Light or reduced-fat mayonnaise and salad dressings (reduced sodium). Avocado. Safflower, olive, or canola oils. Natural peanut or almond butter. Other  Unsalted popcorn and pretzels. The items listed above may not be a complete list of recommended foods or beverages. Contact your dietitian for more options.  What foods are not recommended? Grains  White bread. White pasta. White rice. Refined cornbread. Bagels and croissants. Crackers that contain trans fat. Vegetables  Creamed or fried vegetables. Vegetables in a cheese sauce. Regular canned vegetables. Regular canned tomato sauce and paste. Regular tomato and vegetable juices. Fruits  Canned fruit in light or heavy syrup. Fruit juice. Meat and Other Protein Products  Fatty cuts of meat. Ribs, chicken wings, bacon, sausage, bologna, salami, chitterlings, fatback, hot dogs, bratwurst, and packaged luncheon meats. Salted nuts and seeds. Canned beans with salt. Dairy  Whole or 2% milk, cream, half-and-half, and cream cheese. Whole-fat or sweetened yogurt. Full-fat cheeses   or blue cheese. Nondairy creamers and whipped toppings. Processed cheese, cheese spreads, or cheese curds. Condiments  Onion and garlic  salt, seasoned salt, table salt, and sea salt. Canned and packaged gravies. Worcestershire sauce. Tartar sauce. Barbecue sauce. Teriyaki sauce. Soy sauce, including reduced sodium. Steak sauce. Fish sauce. Oyster sauce. Cocktail sauce. Horseradish. Ketchup and mustard. Meat flavorings and tenderizers. Bouillon cubes. Hot sauce. Tabasco sauce. Marinades. Taco seasonings. Relishes. Fats and Oils  Butter, stick margarine, lard, shortening, ghee, and bacon fat. Coconut, palm kernel, or palm oils. Regular salad dressings. Other  Pickles and olives. Salted popcorn and pretzels. The items listed above may not be a complete list of foods and beverages to avoid. Contact your dietitian for more information.  Where can I find more information? National Heart, [employer], [employer] Institute: www.nhlbi.nih.gov/health/health-topics/topics/dash/ This information is not intended to replace advice given to you by your health care provider. Make sure you discuss any questions you have with your health care provider. Document Released: 11/02/2011 Document Revised: 04/20/2016 Document Reviewed: 09/17/2013 Elsevier Interactive Patient Education  2017 [employer] Inc[employer]  

## 2016-11-03 NOTE — Assessment & Plan Note (Addendum)
Mostly at goal for age. She remains under 150/90.  - Med mgt per her Cardiologist  - Unless her cardiologist Dr. Rennis GoldenHilty feels differently goal would be 150/90 or less. We'll await his input as well.  - Advised to work on weight loss and increasing her exercise. DASH diet discussed and handouts provided.  - Importance of ambulatory blood pressure monitoring d/c pt

## 2016-11-03 NOTE — Assessment & Plan Note (Addendum)
A1c very well controlled today at 6.6.  Up-to-date on screening test  Continue diet and lifestyle modification  No meds

## 2016-11-03 NOTE — Progress Notes (Signed)
Assessment and Plan:  1. DM type 2 with diabetic mixed hyperlipidemia (HCC)   2. Primary insomnia   3. Mixed hyperlipidemia   4. Essential hypertension   5. Emotional stress   6. Type 2 diabetes mellitus with hyperosmolarity without coma, without long-term current use of insulin (HCC)   7. Alzheimer's dementia with behavioral disturbance, unspecified timing of dementia onset   8. Overweight (BMI 25.0-29.9)   9. Osteoporosis, unspecified osteoporosis type, unspecified pathological fracture presence    - RF's given per pt's request of medications I provide  Insomnia Couldn't tol melatonin- made her have crazy dreams. But she is happy with her sleep cycle now.  The problem of recurrent insomnia is discussed.   Avoidance of caffeine sources is strongly encouraged.   Sleep hygiene issues are reviewed.     DM2 (diabetes mellitus, type 2) - diet controlled A1c very well controlled today at 6.6.  Up-to-date on screening test  Continue diet and lifestyle modification  No meds    HTN (hypertension) Mostly at goal for age. She remains under 150/90.  - Med mgt per her Cardiologist  - Unless her cardiologist Dr. Rennis GoldenHilty feels differently goal would be 150/90 or less. We'll await his input as well.  - Advised to work on weight loss and increasing her exercise. DASH diet discussed and handouts provided.  - Importance of ambulatory blood pressure monitoring d/c pt    Hyperlipidemia Exercise and prudent diet recommended  Continue medications per her Cardiologist  Counseling done re: lifestyle mod/ diet mod    Emotional stress Emotionally doing well. Counseling done.  On Fish oil- which can help with mood.  Encouraged reg exercise      Alzheimer's disease Sees Dr. Royston SinnerJoseph Miller- neurologist    Overweight (BMI 25.0-29.9) Counseling done  All questions answered  Handouts given if patient desired them    Osteoporosis Told patient they will need  a yearly complete physical exam - which is when we do appropriate screening tests etc; this will need to be made in addition to any chronic disease mgt appts.      -Reminded patient the need for yearly complete physical exam office visits in addition to office visits for management of the chronic diseases   -Gross side effects, risk and benefits, and alternatives of medications discussed with patient.  Patient is aware that all medications have potential side effects and we are unable to predict every side effect or drug-drug interaction that may occur.  Expresses verbal understanding and consents to current therapy plan and treatment regimen.  Orders Placed This Encounter  Procedures  . POCT HgB A1C    New Prescriptions   No medications on file    Modified Medications   No medications on file    Discontinued Medications   MELATONIN 5 MG TABS    Take 1 tablet by mouth at bedtime.     Follow Up:  Return in about 3 months (around 02/01/2017) for DM; HTN, wt, chol, sleep.   Please see AVS handed out to patient at the end of our visit for further patient instructions/ counseling done pertaining to today's office visit.    Note: This document was prepared using Dragon voice recognition software and may include unintentional dictation errors.  Sabrina [name] 2:08 PM ----------------------------------------------------------------------------------------------------------------------   Subjective:  HPI: Sabrina Bentonnne C Lambert86 y.o. female  presents for 3 month follow up for multiple medical problems.   HTN:  145/ 50 when last checked BP at  home.  NO low salt diet- esp due to holidays.   - walks 1/2 mile most of the time; 6 days week- ;   No sx/ complaints  Last 3 blood pressure readings in our office are as follows: BP Readings from Last 3 Encounters:  11/03/16 (!) 145/58  09/18/16 128/71  09/01/16 130/67      CHOL:  No S-E meds  Last lipid panel as follows:    Lab Results  Component Value Date   CHOL 128 06/09/2014   HDL 45 06/09/2014   LDLCALC 56 06/09/2014   TRIG 133 06/09/2014      DM:   Last time A1c was 7.0 on 7/15;    Today 6.6 ! Excellent.    She is not using a glucometer at home and does not monitor it.   She denies any hyper or hypoglycemia-type symptoms.   She had a foot exam and diabetic eye exam August 2017 and June 2017 respectively   Lab Results  Component Value Date   HMDIABEYEEXA No Retinopathy 05/13/2015   Foot exam- UTD Last A1C in the office was:  Lab Results  Component Value Date   HGBA1C 6.6 11/03/2016   HGBA1C 7.0 06/09/2014   Lab Results  Component Value Date   LDLCALC 56 06/09/2014   CREATININE 0.8 06/09/2014      Weight:   --->  Holding steady.  Hasn't been as active as she usually is Wt Readings from Last 3 Encounters:  11/03/16 170 lb 9.6 oz (77.4 kg)  09/18/16 168 lb 6.4 oz (76.4 kg)  09/01/16 167 lb 12.8 oz (76.1 kg)   BMI Readings from Last 3 Encounters:  11/03/16 28.83 kg/m  09/18/16 28.46 kg/m  09/01/16 28.36 kg/m     Insomnia- doing well off the melatonin- which caused crazy dreams.  No complaints.    Patient Care Team    Relationship Specialty Notifications Start End  Sabrina Lot, DO PCP - General Family Medicine  08/04/16   [name], MD Consulting Physician Cardiology  08/04/16   Sheran Luz, MD Consulting Physician Physical Medicine and Rehabilitation  08/04/16   Venancio Poisson, MD Consulting Physician Dermatology  08/04/16   Linus Salmons, MD Consulting Physician Neurology  08/05/16    Comment: [phone]       Review of Systems  Constitutional: Negative for chills, fever and malaise/fatigue.  Eyes: Negative for blurred vision and double vision.  Respiratory: Negative for shortness of breath.   Cardiovascular: Negative for chest pain, palpitations, claudication and PND.  Gastrointestinal: Negative for diarrhea, nausea and vomiting.  Genitourinary:  Negative for dysuria and frequency.  Neurological: Negative for dizziness, weakness and headaches.  Psychiatric/Behavioral: Negative for depression. The patient is not nervous/anxious and does not have insomnia.      Objective:  Physical Exam: BP (!) 145/58   Pulse 64   Ht 5' 4.5" (1.638 m)   Wt 170 lb 9.6 oz (77.4 kg)   BMI 28.83 kg/m  Body mass index is 28.83 kg/m. General: Well nourished, in no apparent distress. Eyes: PERRLA, EOMs, conjunctiva clr no swelling or erythema ENT/Mouth: Hearing appears normal.  Mucus Membranes Moist  Neck: Supple, no masses Resp: Respiratory effort- normal, ECTA B/L w/o W/R/R  Cardio: RRR w/o MRGs. Abdomen: no gross distention. Lymphatics:  Brisk peripheral pulses, less 2 sec cap RF, B/L LE edema L>er R  M-sk: Full ROM, 5/5 strength, normal gait.  Skin: Warm, dry without rashes Neuro: Alert, Oriented Psych: Normal affect, Insight and Judgment  appropriate.    Current Medications:  Current Outpatient Prescriptions on File Prior to Visit  Medication Sig Dispense Refill  . acetaminophen (TYLENOL) 650 MG CR tablet 650 mg as needed.    . Alendronate Sodium (FOSAMAX PO) Take by mouth once a week.    . Ascorbic Acid (VITAMIN C PO) Take by mouth daily.    Marland Kitchen. aspirin 81 MG EC tablet Take 81 mg by mouth daily. Swallow whole.    . benazepril (LOTENSIN) 40 MG tablet TAKE 1 TABLET BY MOUTH DAILY 90 tablet 2  . calcium carbonate (OS-CAL) 600 MG TABS tablet Take 600 mg by mouth daily with breakfast.     . Cyanocobalamin (VITAMIN B-12 PO) Take by mouth daily.    Marland Kitchen. diltiazem (CARDIZEM CD) 240 MG 24 hr capsule TAKE 1 CAPSULE BY MOUTH DAILY. 90 capsule 2  . donepezil (ARICEPT) 10 MG tablet Take 10 mg by mouth at bedtime.    Marland Kitchen. doxazosin (CARDURA) 2 MG tablet TAKE 1 TABLET BY MOUTH DAILY. 90 tablet 3  . glipiZIDE (GLUCOTROL XL) 2.5 MG 24 hr tablet Take 1 tablet (2.5 mg total) by mouth daily. 90 tablet 0  . hydrochlorothiazide (MICROZIDE) 12.5 MG capsule TAKE 1  CAPSULE BY MOUTH DAILY. 90 capsule 2  . memantine (NAMENDA) 10 MG tablet Take 10 mg by mouth 2 (two) times daily. 1/2 TABLET QAM AND ONE TABLET QHS    . metoprolol succinate (TOPROL-XL) 25 MG 24 hr tablet TAKE 1 TABLET BY MOUTH DAILY. 90 tablet 2  . Omega-3 Fatty Acids (FISH OIL) 1000 MG CAPS Take 3 capsules by mouth daily.    . pravastatin (PRAVACHOL) 20 MG tablet Take 20 mg by mouth daily.    . vitamin E (VITAMIN E) 400 UNIT capsule Take 400 Units by mouth daily.     No current facility-administered medications on file prior to visit.     Medical History:  Patient Active Problem List   Diagnosis Date Noted  . Emotional stress 08/05/2016    Priority: High  . Insomnia 08/05/2016    Priority: High  . HTN (hypertension) 07/03/2013    Priority: High  . Hyperlipidemia 07/03/2013    Priority: High  . DM2 (diabetes mellitus, type 2) - diet controlled 07/03/2013    Priority: High  . Overweight (BMI 25.0-29.9) 08/05/2016    Priority: Medium  . Osteoporosis 08/05/2016    Priority: Medium  . Alzheimer's disease 01/01/2014    Priority: Medium  . Vitamin D deficiency 08/05/2016    Priority: Low  . Vitamin B12 deficiency without anemia 08/05/2016    Priority: Low  . Venous insufficiency of left leg 01/12/2015    Priority: Low  . Diverticular disease 08/05/2016  . LBBB (left bundle branch block) 01/12/2016  . DOE (dyspnea on exertion) 01/12/2016  . PVC (premature ventricular contraction) 07/03/2013    Allergies:  Allergies  Allergen Reactions  . Codeine   . Penicillins      Family history-  Reviewed; changed as appropriate  Social history-  Reviewed; changed as appropriate

## 2016-11-03 NOTE — Assessment & Plan Note (Addendum)
Couldn't tol melatonin- made her have crazy dreams. But she is happy with her sleep cycle now.  The problem of recurrent insomnia is discussed.   Avoidance of caffeine sources is strongly encouraged.   Sleep hygiene issues are reviewed.

## 2016-11-03 NOTE — Assessment & Plan Note (Addendum)
Emotionally doing well. Counseling done.  On Fish oil- which can help with mood.  Encouraged reg exercise

## 2016-11-03 NOTE — Assessment & Plan Note (Addendum)
Exercise and prudent diet recommended  Continue medications per her Cardiologist  Counseling done re: lifestyle mod/ diet mod

## 2016-12-03 NOTE — Assessment & Plan Note (Signed)
Told patient they will need a yearly complete physical exam - which is when we do appropriate screening tests etc; this will need to be made in addition to any chronic disease mgt appts.

## 2016-12-03 NOTE — Assessment & Plan Note (Signed)
Sees Dr. Joseph Miller- neurologist 

## 2016-12-03 NOTE — Assessment & Plan Note (Signed)
Counseling done  All questions answered  Handouts given if patient desired them 

## 2016-12-14 ENCOUNTER — Other Ambulatory Visit: Payer: Self-pay | Admitting: Family Medicine

## 2017-01-23 ENCOUNTER — Other Ambulatory Visit: Payer: Self-pay

## 2017-01-23 MED ORDER — ALENDRONATE SODIUM 70 MG PO TABS: 70.0000 mg | ORAL_TABLET | ORAL | 11 refills | Status: DC

## 2017-01-23 NOTE — Telephone Encounter (Signed)
We have not prescribed this medication for this patient as of yet.  Please refill if appropriate.  Sabrina [name]. Nelson, CMA

## 2017-01-24 ENCOUNTER — Ambulatory Visit (INDEPENDENT_AMBULATORY_CARE_PROVIDER_SITE_OTHER): Payer: Medicare Other | Admitting: Internal Medicine

## 2017-01-24 ENCOUNTER — Encounter: Payer: Self-pay | Admitting: Internal Medicine

## 2017-01-24 VITALS — BP 148/42 | HR 93 | Ht 64.5 in | Wt 164.0 lb

## 2017-01-24 DIAGNOSIS — E782 Mixed hyperlipidemia: Secondary | ICD-10-CM

## 2017-01-24 DIAGNOSIS — I447 Left bundle-branch block, unspecified: Secondary | ICD-10-CM | POA: Diagnosis not present

## 2017-01-24 DIAGNOSIS — Z79899 Other long term (current) drug therapy: Secondary | ICD-10-CM | POA: Diagnosis not present

## 2017-01-24 DIAGNOSIS — I493 Ventricular premature depolarization: Secondary | ICD-10-CM

## 2017-01-24 NOTE — Patient Instructions (Signed)
Medication Instructions:  Your physician recommends that you continue on your current medications as directed. Please refer to the Current Medication list given to you today.  If you need a refill on your cardiac medications before your next appointment, please call your pharmacy.  Labwork: FASTING LIPID PANEL THIS WEEK AT [facility] ON THE 1ST FLOOR  Testing/Procedures: NONE  Follow-Up: Your physician wants you to follow-up in: 12 MONTHS You will receive a reminder letter in the mail two months in advance. If you don't receive a letter, please call our office IN December 2018 to schedule that follow-up appointment.   Thank you for choosing [facility]!!   Zoila ShutterKENNETH HILTY, MD Marcelino DusterMichelle, LPN

## 2017-01-24 NOTE — Progress Notes (Signed)
OFFICE NOTE  Chief Complaint:  No complaints  Primary Care Physician: Sabrina Lot, DO  HPI:  Sabrina [name] is a pleasant 83 year old female. She has had a history of asymptomatic nonsustained ventricular tachycardia. She has had a cardiac catheterization performed in 2001 that showed only LVH. She had a nuclear study in 2007 that was negative for ischemia. She continues to have no awareness of any arrhythmias.  She is unlimited from a physical standpoint. She has no ankle edema, no unusual breathlessness. She is diabetic and has a history of dyslipidemia and hypertension. Just has mild carotid artery disease. She had carotid Dopplers in 2009 that showed less than 50% narrowing on the left and none on the right. She had been seen by Dr. Adella [name] at the [facility] in [facility]. The concern with her family history of Alzheimer's is that she may be at risk for this. She has basically been on Namenda and Aricept for this. She does have some memory issues but according to her this is more preventative. I think she probably has some early issues with dementia. Otherwise she has no other complaints. He occasionally has some swelling of the left leg, but he injured that in the past.  I saw Sabrina [name] today in the office. She is doing well and is not bothered by PVCs. There is small amount of swelling in her left ankle. She's concerned about venous or arterial insufficiency. She reports her memory is pretty stable and saw our neurologist recently he was pleased with out she was doing.  Sabrina [name] returns today for follow-up. She denies any chest pain but has had some worsening shortness of breath. Her EKG today however shows a new left bundle branch block. She previously had a mild interventricular conduction delay. She certainly has risk factors for coronary artery disease and her last stress test was about 8 years ago. She's also had PVCs for which she is not particularly bothered.  She's been under a [name] of stress recently with her husband has been in and out of the hospital.  01/24/2017  Sabrina [name] was seen today in follow-up. She denies any new chest pain or worsening shortness of breath. Blood pressure appears to be well-controlled today on the diastolic was low she reported seeing her neurologist a few days ago and her blood pressure is 127/80. She denies any lightheadedness, fatigue or other presyncopal symptoms. EKG again shows frequent PVCs although she is asymptomatic with this. We have not had success with trying to suppress these before with increasing doses of beta blocker. She is overdue for lab work and her primary care provider had ordered a full cholesterol will follow as well as some other labs in September that were not yet obtained.  PMHx:  Past Medical History:  Diagnosis Date  . Cognitive disorder    early dementia?  . Diverticular disease    L colon  . History of Doppler ultrasound 12/30/2007   carotid doppler; L Mid ICA 0-49% diameter reduction, R & L subclavian artery 0-49% diameter reduction   . History of nuclear stress test 11/23/2006   dipyridamole; normal pattern of perfusion; low risk scan   . Hyperlipidemia   . Hypertension     Past Surgical History:  Procedure Laterality Date  . CARDIAC CATHETERIZATION  2001   non occlusive disease, 40% stenosis of LAD; LVH  . CATARACT EXTRACTION  04/2013, 05/2013   x2  . eyelid surgery  02/26/2003   r/t dematochalsis of  upper eyelids  . POLYPECTOMY  06/2003  . TRANSTHORACIC ECHOCARDIOGRAM  08/09/2011   EF>55%; mild concentric LVH; LA mildly dilated; mild MV prolapse; trace MR, mild TR, trace AV regurg, trace PV regurg    FAMHx:  Family History  Problem Relation Age of Onset  . Hypertension Mother   . Stroke Mother   . Heart disease Father   . Diabetes Brother   . Cancer Sister   . Heart Problems Brother     MV prolapse  . Heart Problems Sister     MV prolapse  . Hypertension Sister     x2      SOCHx:   reports that she has never smoked. She has never used smokeless tobacco. She reports that she does not drink alcohol or use drugs.  ALLERGIES:  Allergies  Allergen Reactions  . Codeine   . Penicillins     ROS: Pertinent items noted in HPI and remainder of comprehensive ROS otherwise negative.  HOME MEDS: Current Outpatient Prescriptions  Medication Sig Dispense Refill  . acetaminophen (TYLENOL) 650 MG CR tablet 650 mg as needed.    Marland Kitchen alendronate (FOSAMAX) 70 MG tablet Take 1 tablet (70 mg total) by mouth every 7 (seven) days. Take with a full glass of water on an empty stomach. 4 tablet 11  . Ascorbic Acid (VITAMIN C PO) Take by mouth daily.    Marland Kitchen aspirin 81 MG EC tablet Take 81 mg by mouth daily. Swallow whole.    . benazepril (LOTENSIN) 40 MG tablet TAKE 1 TABLET BY MOUTH DAILY 90 tablet 2  . calcium carbonate (OS-CAL) 600 MG TABS tablet Take 600 mg by mouth daily with breakfast.     . Cyanocobalamin (VITAMIN B-12 PO) Take by mouth daily.    Marland Kitchen diltiazem (CARDIZEM CD) 240 MG 24 hr capsule TAKE 1 CAPSULE BY MOUTH DAILY. 90 capsule 2  . donepezil (ARICEPT) 10 MG tablet Take 10 mg by mouth at bedtime.    Marland Kitchen doxazosin (CARDURA) 2 MG tablet TAKE 1 TABLET BY MOUTH DAILY. 90 tablet 3  . glipiZIDE (GLUCOTROL XL) 2.5 MG 24 hr tablet TAKE 1 TABLET (2.5 MG TOTAL) BY MOUTH DAILY. 90 tablet 0  . hydrochlorothiazide (MICROZIDE) 12.5 MG capsule TAKE 1 CAPSULE BY MOUTH DAILY. 90 capsule 2  . memantine (NAMENDA) 10 MG tablet Take 10 mg by mouth 2 (two) times daily. 1/2 TABLET QAM AND ONE TABLET QHS    . metoprolol succinate (TOPROL-XL) 25 MG 24 hr tablet TAKE 1 TABLET BY MOUTH DAILY. 90 tablet 2  . Omega-3 Fatty Acids (FISH OIL) 1000 MG CAPS Take 3 capsules by mouth daily.    . pravastatin (PRAVACHOL) 20 MG tablet Take 20 mg by mouth daily.    . vitamin E (VITAMIN E) 400 UNIT capsule Take 400 Units by mouth daily.     No current facility-administered medications for this visit.      LABS/IMAGING: No results found for this or any previous visit (from the past 48 hour(s)). No results found.  VITALS: BP (!) 148/42   Pulse 93   Ht 5' 4.5" (1.638 m)   Wt 164 lb (74.4 kg)   BMI 27.72 kg/m   EXAM: General appearance: alert and no distress Neck: no adenopathy, no carotid bruit, no JVD, supple, symmetrical, trachea midline and thyroid not enlarged, symmetric, no tenderness/mass/nodules Lungs: clear to auscultation bilaterally Heart: regular rate and rhythm, S1, S2 normal, no murmur, click, rub or gallop Abdomen: soft, non-tender; bowel sounds normal; no  masses,  no organomegaly Extremities: edema 1+ left lower extremity edema Pulses: 2+ and symmetric Skin: Skin color, texture, turgor normal. No rashes or lesions Neurologic: Grossly normal  EKG: Sinus rhythm with first-degree AV block and PVCs at 93, LBBB  ASSESSMENT: 1. New left bundle branch block 2. Hypertension 3. Asymptomatic PVCs 4. Hyperlipidemia 5. Diabetes 6. Cognitive impairment 7. Venous insufficiency  PLAN: 1.  Mrs. Lalla BrothersLambert feels well and is asymptomatic with her PVCs. We have not been able to suppress those with beta blocker therefore will continue her current medications. She did undergo stress testing last year for her new left bundle branch block and was demonstrated to be nonischemic. LVEF is normal. She reports good control of venous insufficiency particular swelling in her left leg using a compression stocking. We'll continue current medications plan to see her back annually or sooner as necessary.  Chrystie NoseKenneth C. Yetzali Weld, MD, [facility] Attending Cardiologist [facility]  Lisette AbuKenneth C [facility] 01/24/2017, 10:18 AM

## 2017-01-26 LAB — LIPID PANEL
CHOL/HDL RATIO: 2.9 ratio (ref <5.0)
CHOLESTEROL: 127 mg/dL (ref <200)
HDL: 44 mg/dL — ABNORMAL LOW (ref >50)
LDL CALC: 48 mg/dL (ref <100)
Triglycerides: 174 mg/dL — ABNORMAL HIGH (ref <150)
VLDL: 35 mg/dL — AB (ref <30)

## 2017-01-29 ENCOUNTER — Encounter: Payer: Self-pay | Admitting: Internal Medicine

## 2017-02-02 ENCOUNTER — Ambulatory Visit: Payer: Medicare Other | Admitting: Family Medicine

## 2017-02-05 ENCOUNTER — Ambulatory Visit (INDEPENDENT_AMBULATORY_CARE_PROVIDER_SITE_OTHER): Payer: Medicare Other | Admitting: Adult Health

## 2017-02-05 ENCOUNTER — Encounter: Payer: Self-pay | Admitting: Adult Health

## 2017-02-05 VITALS — BP 150/67 | HR 77 | Wt 167.2 lb

## 2017-02-05 DIAGNOSIS — E785 Hyperlipidemia, unspecified: Secondary | ICD-10-CM

## 2017-02-05 DIAGNOSIS — E538 Deficiency of other specified B group vitamins: Secondary | ICD-10-CM | POA: Diagnosis not present

## 2017-02-05 DIAGNOSIS — E559 Vitamin D deficiency, unspecified: Secondary | ICD-10-CM | POA: Diagnosis not present

## 2017-02-05 DIAGNOSIS — E1100 Type 2 diabetes mellitus with hyperosmolarity without nonketotic hyperglycemic-hyperosmolar coma (NKHHC): Secondary | ICD-10-CM

## 2017-02-05 DIAGNOSIS — G308 Other Alzheimer's disease: Secondary | ICD-10-CM | POA: Diagnosis not present

## 2017-02-05 DIAGNOSIS — I493 Ventricular premature depolarization: Secondary | ICD-10-CM

## 2017-02-05 DIAGNOSIS — F0280 Dementia in other diseases classified elsewhere without behavioral disturbance: Secondary | ICD-10-CM

## 2017-02-05 DIAGNOSIS — I10 Essential (primary) hypertension: Secondary | ICD-10-CM

## 2017-02-05 DIAGNOSIS — F5101 Primary insomnia: Secondary | ICD-10-CM

## 2017-02-05 DIAGNOSIS — M810 Age-related osteoporosis without current pathological fracture: Secondary | ICD-10-CM

## 2017-02-05 LAB — POCT GLYCOSYLATED HEMOGLOBIN (HGB A1C): Hemoglobin A1C: 6.6

## 2017-02-05 LAB — POCT UA - MICROALBUMIN
Albumin/Creatinine Ratio, Urine, POC: <30
CREATININE, POC: 100 mg/dL
Microalbumin Ur, POC: 10 mg/L

## 2017-02-05 NOTE — Assessment & Plan Note (Signed)
Reports restful sleep currently.

## 2017-02-05 NOTE — Assessment & Plan Note (Signed)
Recent Cards visit: Asymptomatic PVCs, New onset LBBB, however is nonischemic. Continue medications as directed. Annual f/u with Cards.

## 2017-02-05 NOTE — Assessment & Plan Note (Signed)
A1c checked today. Continue heart healthy diet and regular movement.

## 2017-02-05 NOTE — Assessment & Plan Note (Signed)
Recent visit to Neurologist - Alzheimer's stable, no change to current medications.   Next visit to Neurologist 09/2017.

## 2017-02-05 NOTE — Patient Instructions (Signed)
Hypertension Hypertension, commonly called high blood pressure, is when the force of blood pumping through the arteries is too strong. The arteries are the blood vessels that carry blood from the heart throughout the body. Hypertension forces the heart to work harder to pump blood and may cause arteries to become narrow or stiff. Having untreated or uncontrolled hypertension can cause heart attacks, strokes, kidney disease, and other problems. A blood pressure reading consists of a higher number over a lower number. Ideally, your blood pressure should be below 120/80. The first ("top") number is called the systolic pressure. It is a measure of the pressure in your arteries as your heart beats. The second ("bottom") number is called the diastolic pressure. It is a measure of the pressure in your arteries as the heart relaxes. What are the causes? The cause of this condition is not known. What increases the risk? Some risk factors for high blood pressure are under your control. Others are not. Factors you can change   Smoking.  Having type 2 diabetes mellitus, high cholesterol, or both.  Not getting enough exercise or physical activity.  Being overweight.  Having too much fat, sugar, calories, or salt (sodium) in your diet.  Drinking too much alcohol. Factors that are difficult or impossible to change   Having chronic kidney disease.  Having a family history of high blood pressure.  Age. Risk increases with age.  Race. You may be at higher risk if you are African-American.  Gender. Men are at higher risk than women before age 45. After age 65, women are at higher risk than men.  Having obstructive sleep apnea.  Stress. What are the signs or symptoms? Extremely high blood pressure (hypertensive crisis) may cause:  Headache.  Anxiety.  Shortness of breath.  Nosebleed.  Nausea and vomiting.  Severe chest pain.  Jerky movements you cannot control (seizures). How is this  diagnosed? This condition is diagnosed by measuring your blood pressure while you are seated, with your arm resting on a surface. The cuff of the blood pressure monitor will be placed directly against the skin of your upper arm at the level of your heart. It should be measured at least twice using the same arm. Certain conditions can cause a difference in blood pressure between your right and left arms. Certain factors can cause blood pressure readings to be lower or higher than normal (elevated) for a short period of time:  When your blood pressure is higher when you are in a health care provider's office than when you are at home, this is called white coat hypertension. Most people with this condition do not need medicines.  When your blood pressure is higher at home than when you are in a health care provider's office, this is called masked hypertension. Most people with this condition may need medicines to control blood pressure. If you have a high blood pressure reading during one visit or you have normal blood pressure with other risk factors:  You may be asked to return on a different day to have your blood pressure checked again.  You may be asked to monitor your blood pressure at home for 1 week or longer. If you are diagnosed with hypertension, you may have other blood or imaging tests to help your health care provider understand your overall risk for other conditions. How is this treated? This condition is treated by making healthy lifestyle changes, such as eating healthy foods, exercising more, and reducing your alcohol intake. Your health   care provider may prescribe medicine if lifestyle changes are not enough to get your blood pressure under control, and if:  Your systolic blood pressure is above 130.  Your diastolic blood pressure is above 80. Your personal target blood pressure may vary depending on your medical conditions, your age, and other factors. Follow these instructions  at home: Eating and drinking   Eat a diet that is high in fiber and potassium, and low in sodium, added sugar, and fat. An example eating plan is called the DASH (Dietary Approaches to Stop Hypertension) diet. To eat this way:  Eat plenty of fresh fruits and vegetables. Try to fill half of your plate at each meal with fruits and vegetables.  Eat whole grains, such as whole wheat pasta, brown rice, or whole grain bread. Fill about one quarter of your plate with whole grains.  Eat or drink low-fat dairy products, such as skim milk or low-fat yogurt.  Avoid fatty cuts of meat, processed or cured meats, and poultry with skin. Fill about one quarter of your plate with lean proteins, such as fish, chicken without skin, beans, eggs, and tofu.  Avoid premade and processed foods. These tend to be higher in sodium, added sugar, and fat.  Reduce your daily sodium intake. Most people with hypertension should eat less than 1,500 mg of sodium a day.  Limit alcohol intake to no more than 1 drink a day for nonpregnant women and 2 drinks a day for men. One drink equals 12 oz of beer, 5 oz of wine, or 1 oz of hard liquor. Lifestyle   Work with your health care provider to maintain a healthy body weight or to lose weight. Ask what an ideal weight is for you.  Get at least 30 minutes of exercise that causes your heart to beat faster (aerobic exercise) most days of the week. Activities may include walking, swimming, or biking.  Include exercise to strengthen your muscles (resistance exercise), such as pilates or lifting weights, as part of your weekly exercise routine. Try to do these types of exercises for 30 minutes at least 3 days a week.  Do not use any products that contain nicotine or tobacco, such as cigarettes and e-cigarettes. If you need help quitting, ask your health care provider.  Monitor your blood pressure at home as told by your health care provider.  Keep all follow-up visits as told by  your health care provider. This is important. Medicines   Take over-the-counter and prescription medicines only as told by your health care provider. Follow directions carefully. Blood pressure medicines must be taken as prescribed.  Do not skip doses of blood pressure medicine. Doing this puts you at risk for problems and can make the medicine less effective.  Ask your health care provider about side effects or reactions to medicines that you should watch for. Contact a health care provider if:  You think you are having a reaction to a medicine you are taking.  You have headaches that keep coming back (recurring).  You feel dizzy.  You have swelling in your ankles.  You have trouble with your vision. Get help right away if:  You develop a severe headache or confusion.  You have unusual weakness or numbness.  You feel faint.  You have severe pain in your chest or abdomen.  You vomit repeatedly.  You have trouble breathing. Summary  Hypertension is when the force of blood pumping through your arteries is too strong. If this condition is   not controlled, it may put you at risk for serious complications.  Your personal target blood pressure may vary depending on your medical conditions, your age, and other factors. For most people, a normal blood pressure is less than 120/80.  Hypertension is treated with lifestyle changes, medicines, or a combination of both. Lifestyle changes include weight loss, eating a healthy, low-sodium diet, exercising more, and limiting alcohol. This information is not intended to replace advice given to you by your health care provider. Make sure you discuss any questions you have with your health care provider. Document Released: 11/13/2005 Document Revised: 10/11/2016 Document Reviewed: 10/11/2016 Elsevier Interactive Patient Education  2017 [employer] Inc.   Diabetes Mellitus and Food It is important for you to manage your blood sugar (glucose)  level. Your blood glucose level can be greatly affected by what you eat. Eating healthier foods in the appropriate amounts throughout the day at about the same time each day will help you control your blood glucose level. It can also help slow or prevent worsening of your diabetes mellitus. Healthy eating may even help you improve the level of your blood pressure and reach or maintain a healthy weight. General recommendations for healthful eating and cooking habits include:  Eating meals and snacks regularly. Avoid going long periods of time without eating to lose weight.  Eating a diet that consists mainly of plant-based foods, such as fruits, vegetables, nuts, legumes, and whole grains.  Using low-heat cooking methods, such as baking, instead of high-heat cooking methods, such as deep frying. Work with your dietitian to make sure you understand how to use the Nutrition Facts information on food labels. How can food affect me? Carbohydrates  Carbohydrates affect your blood glucose level more than any other type of food. Your dietitian will help you determine how many carbohydrates to eat at each meal and teach you how to count carbohydrates. Counting carbohydrates is important to keep your blood glucose at a healthy level, especially if you are using insulin or taking certain medicines for diabetes mellitus. Alcohol  Alcohol can cause sudden decreases in blood glucose (hypoglycemia), especially if you use insulin or take certain medicines for diabetes mellitus. Hypoglycemia can be a life-threatening condition. Symptoms of hypoglycemia (sleepiness, dizziness, and disorientation) are similar to symptoms of having too much alcohol. If your health care provider has given you approval to drink alcohol, do so in moderation and use the following guidelines:  Women should not have more than one drink per day, and men should not have more than two drinks per day. One drink is equal to:  12 oz of beer.  5  oz of wine.  1 oz of hard liquor.  Do not drink on an empty stomach.  Keep yourself hydrated. Have water, diet soda, or unsweetened iced tea.  Regular soda, juice, and other mixers might contain a lot of carbohydrates and should be counted. What foods are not recommended? As you make food choices, it is important to remember that all foods are not the same. Some foods have fewer nutrients per serving than other foods, even though they might have the same number of calories or carbohydrates. It is difficult to get your body what it needs when you eat foods with fewer nutrients. Examples of foods that you should avoid that are high in calories and carbohydrates but low in nutrients include:  Trans fats (most processed foods list trans fats on the Nutrition Facts label).  Regular soda.  Juice.  Candy.  Sweets,  such as cake, pie, doughnuts, and cookies.  Fried foods. What foods can I eat? Eat nutrient-rich foods, which will nourish your body and keep you healthy. The food you should eat also will depend on several factors, including:  The calories you need.  The medicines you take.  Your weight.  Your blood glucose level.  Your blood pressure level.  Your cholesterol level. You should eat a variety of foods, including:  Protein.  Lean cuts of meat.  Proteins low in saturated fats, such as fish, egg whites, and beans. Avoid processed meats.  Fruits and vegetables.  Fruits and vegetables that may help control blood glucose levels, such as apples, mangoes, and yams.  Dairy products.  Choose fat-free or low-fat dairy products, such as milk, yogurt, and cheese.  Grains, bread, pasta, and rice.  Choose whole grain products, such as multigrain bread, whole oats, and brown rice. These foods may help control blood pressure.  Fats.  Foods containing healthful fats, such as nuts, avocado, olive oil, canola oil, and fish. Does everyone with diabetes mellitus have the same  meal plan? Because every person with diabetes mellitus is different, there is not one meal plan that works for everyone. It is very important that you meet with a dietitian who will help you create a meal plan that is just right for you. This information is not intended to replace advice given to you by your health care provider. Make sure you discuss any questions you have with your health care provider. Document Released: 08/10/2005 Document Revised: 04/20/2016 Document Reviewed: 10/10/2013 Elsevier Interactive Patient Education  2017 [employer].  Continue all medications as directed.   Please follow-up in 3 months or sooner if needed.

## 2017-02-05 NOTE — Progress Notes (Signed)
Subjective:    Patient ID: Sabrina [name], female    DOB: [birth year], 83 y.o.   MRN: [id number]  HPI:  Sabrina [name] presents for regular f/u.  She was recently seen by Neurology: Late onset Alzheimer's-stable, polyneuropathy, gait difficulty, and lumbar DDD.  F/u 09/2017.  No change to meds. Cardiology:  Asymptomatic PVCs, LBB that is nonischemic, LVEF normal.  F/u 12/2017. No changes to meds. She reports medication compliance and denies SE.  She reports improved mood and restful sleep.  Her husband continues to have "health issues, however he is doing pretty well right now".  She is able to safely ambulate with assistance of a cane.  She reports being able to meet ADLs, IADLs, drive, and take care of herself and her husband.   Patient Care Team    Relationship Specialty Notifications Start End  Thomasene Loteborah Opalski, DO PCP - General Family Medicine  08/04/16   Chrystie NoseKenneth C Hilty, MD Consulting Physician Cardiology  08/04/16   Sheran Luzichard Ramos, MD Consulting Physician Physical Medicine and Rehabilitation  08/04/16   Venancio PoissonLaura Lomax, MD Consulting Physician Dermatology  08/04/16   Linus SalmonsJoseph K Miller, MD Consulting Physician Neurology  08/05/16    Comment: [phone]    Patient Active Problem List   Diagnosis Date Noted  . Overweight (BMI 25.0-29.9) 08/05/2016  . Osteoporosis 08/05/2016  . Diverticular disease 08/05/2016  . Emotional stress 08/05/2016  . Insomnia 08/05/2016  . Vitamin D deficiency 08/05/2016  . Vitamin B12 deficiency without anemia 08/05/2016  . LBBB (left bundle branch block) 01/12/2016  . DOE (dyspnea on exertion) 01/12/2016  . Venous insufficiency of left leg 01/12/2015  . Alzheimer's disease 01/01/2014  . HTN (hypertension) 07/03/2013  . PVC (premature ventricular contraction) 07/03/2013  . Mixed hyperlipidemia 07/03/2013  . DM2 (diabetes mellitus, type 2) - diet controlled 07/03/2013     Past Medical History:  Diagnosis Date  . Cognitive disorder    early dementia?  .  Diverticular disease    L colon  . History of Doppler ultrasound 12/30/2007   carotid doppler; L Mid ICA 0-49% diameter reduction, R & L subclavian artery 0-49% diameter reduction   . History of nuclear stress test 11/23/2006   dipyridamole; normal pattern of perfusion; low risk scan   . Hyperlipidemia   . Hypertension      Past Surgical History:  Procedure Laterality Date  . CARDIAC CATHETERIZATION  2001   non occlusive disease, 40% stenosis of LAD; LVH  . CATARACT EXTRACTION  04/2013, 05/2013   x2  . eyelid surgery  02/26/2003   r/t dematochalsis of upper eyelids  . POLYPECTOMY  06/2003  . TRANSTHORACIC ECHOCARDIOGRAM  08/09/2011   EF>55%; mild concentric LVH; LA mildly dilated; mild MV prolapse; trace MR, mild TR, trace AV regurg, trace PV regurg     Family History  Problem Relation Age of Onset  . Hypertension Mother   . Stroke Mother   . Heart disease Father   . Diabetes Brother   . Cancer Sister   . Heart Problems Brother     MV prolapse  . Heart Problems Sister     MV prolapse  . Hypertension Sister     x2     History  Drug Use No     History  Alcohol Use No     History  Smoking Status  . Never Smoker  Smokeless Tobacco  . Never Used     Outpatient Encounter Prescriptions as of 02/05/2017  Medication Sig  . acetaminophen (  TYLENOL) 650 MG CR tablet 650 mg as needed.  Marland Kitchen alendronate (FOSAMAX) 70 MG tablet Take 1 tablet (70 mg total) by mouth every 7 (seven) days. Take with a full glass of water on an empty stomach.  . Ascorbic Acid (VITAMIN C PO) Take by mouth daily.  Marland Kitchen aspirin 81 MG EC tablet Take 81 mg by mouth daily. Swallow whole.  . benazepril (LOTENSIN) 40 MG tablet TAKE 1 TABLET BY MOUTH DAILY  . calcium carbonate (OS-CAL) 600 MG TABS tablet Take 600 mg by mouth daily with breakfast.   . Cyanocobalamin (VITAMIN B-12 PO) Take by mouth daily.  Marland Kitchen diltiazem (CARDIZEM CD) 240 MG 24 hr capsule TAKE 1 CAPSULE BY MOUTH DAILY.  Marland Kitchen donepezil (ARICEPT) 10 MG  tablet Take 10 mg by mouth at bedtime.  Marland Kitchen doxazosin (CARDURA) 2 MG tablet TAKE 1 TABLET BY MOUTH DAILY.  Marland Kitchen glipiZIDE (GLUCOTROL XL) 2.5 MG 24 hr tablet TAKE 1 TABLET (2.5 MG TOTAL) BY MOUTH DAILY.  . hydrochlorothiazide (MICROZIDE) 12.5 MG capsule TAKE 1 CAPSULE BY MOUTH DAILY.  . memantine (NAMENDA) 10 MG tablet Take 10 mg by mouth 2 (two) times daily. 1/2 TABLET QAM AND ONE TABLET QHS  . metoprolol succinate (TOPROL-XL) 25 MG 24 hr tablet TAKE 1 TABLET BY MOUTH DAILY.  Marland Kitchen Omega-3 Fatty Acids (FISH OIL) 1000 MG CAPS Take 3 capsules by mouth daily.  . pravastatin (PRAVACHOL) 20 MG tablet Take 20 mg by mouth daily.  . vitamin E (VITAMIN E) 400 UNIT capsule Take 400 Units by mouth daily.   No facility-administered encounter medications on file as of 02/05/2017.     Allergies: Codeine and Penicillins  Body mass index is 28.27 kg/m.  Blood pressure (!) 150/67, pulse 77, weight 167 lb 4 oz (75.9 kg).    Review of Systems  Constitutional: Positive for fatigue. Negative for activity change, appetite change, chills, diaphoresis, fever and unexpected weight change.  HENT: Negative for congestion.   Eyes: Negative for visual disturbance.  Respiratory: Negative for cough, chest tightness, shortness of breath, wheezing and stridor.   Cardiovascular: Positive for leg swelling. Negative for chest pain and palpitations.       LLE > RLE in regards to edema.  Gastrointestinal: Negative for abdominal distention, abdominal pain, blood in stool, constipation, diarrhea, nausea and vomiting.  Endocrine: Negative for cold intolerance, heat intolerance, polydipsia, polyphagia and polyuria.  Genitourinary: Negative for difficulty urinating and flank pain.  Musculoskeletal: Positive for arthralgias, back pain, gait problem, joint swelling, myalgias, neck pain and neck stiffness.       Ambulates safely with cane.  Skin: Negative for color change, pallor, rash and wound.  Neurological: Negative for dizziness,  tremors, seizures, speech difficulty, weakness, light-headedness, numbness and headaches.       Late onset Alzheimer's-stable.  Psychiatric/Behavioral: Negative for agitation, behavioral problems, confusion, self-injury, sleep disturbance and suicidal ideas. The patient is not nervous/anxious and is not hyperactive.        Objective:   Physical Exam  Constitutional: She is oriented to person, place, and time. She appears well-developed and well-nourished. No distress.  HENT:  Head: Normocephalic and atraumatic.  Right Ear: External ear normal.  Left Ear: External ear normal.  Eyes: Conjunctivae are normal. Pupils are equal, round, and reactive to light.  Neck: Normal range of motion. Neck supple.  Cardiovascular: Normal rate, regular rhythm, normal heart sounds and intact distal pulses.   No murmur heard. Pulmonary/Chest: Effort normal and breath sounds normal. No respiratory distress. She has  no wheezes. She has no rales. She exhibits no tenderness.  Abdominal: Soft. Bowel sounds are normal. She exhibits no distension.  Musculoskeletal: She exhibits edema and tenderness.       Right ankle: She exhibits normal pulse.       Left ankle: She exhibits swelling. She exhibits normal pulse.       Lumbar back: She exhibits tenderness.  Lymphadenopathy:    She has no cervical adenopathy.  Neurological: She is alert and oriented to person, place, and time. She displays no tremor. No cranial nerve deficit. Gait abnormal.  Skin: Skin is warm and dry. No rash noted. She is not diaphoretic. No erythema. No pallor.  Psychiatric: She has a normal mood and affect. Her behavior is normal. Judgment and thought content normal.          Assessment & Plan:   1. Type 2 diabetes mellitus with hyperosmolarity without coma, without long-term current use of insulin (HCC)   2. Essential hypertension   3. Hyperlipidemia   4. Alzheimer's disease - early onset   5. Age-related osteoporosis without current  pathological fracture   6. Vitamin D deficiency   7. Vitamin B12 deficiency without anemia   8. PVC (premature ventricular contraction)   9. Primary insomnia     HTN (hypertension) Recent Cards visit: Asymptomatic PVCs, New onset LBBB, however is nonischemic. Continue medications as directed. Annual f/u with Cards.  DM2 (diabetes mellitus, type 2) - diet controlled A1c checked today. Continue heart healthy diet and regular movement.  Alzheimer's disease of other onset Recent visit to Neurologist - Alzheimer's stable, no change to current medications.   Next visit to Neurologist 09/2017.  Insomnia Reports restful sleep currently.    FOLLOW-UP:  Return in about 3 months (around 05/08/2017) for Regular Follow Up.

## 2017-02-06 LAB — CBC WITH DIFFERENTIAL/PLATELET
BASOS ABS: 0 10*3/uL (ref 0.0–0.2)
Basos: 0 %
EOS (ABSOLUTE): 0.2 10*3/uL (ref 0.0–0.4)
Eos: 3 %
Hematocrit: 39.3 % (ref 34.0–46.6)
Hemoglobin: 13.1 g/dL (ref 11.1–15.9)
Immature Grans (Abs): 0 10*3/uL (ref 0.0–0.1)
Immature Granulocytes: 0 %
LYMPHS ABS: 2.7 10*3/uL (ref 0.7–3.1)
Lymphs: 46 %
MCH: 30.2 pg (ref 26.6–33.0)
MCHC: 33.3 g/dL (ref 31.5–35.7)
MCV: 91 fL (ref 79–97)
MONOS ABS: 0.6 10*3/uL (ref 0.1–0.9)
Monocytes: 9 %
NEUTROS ABS: 2.6 10*3/uL (ref 1.4–7.0)
Neutrophils: 42 %
PLATELETS: 144 10*3/uL — AB (ref 150–379)
RBC: 4.34 x10E6/uL (ref 3.77–5.28)
RDW: 13.2 % (ref 12.3–15.4)
WBC: 6.1 10*3/uL (ref 3.4–10.8)

## 2017-02-06 LAB — VITAMIN D 25 HYDROXY (VIT D DEFICIENCY, FRACTURES): Vit D, 25-Hydroxy: 58 ng/mL (ref 30.0–100.0)

## 2017-02-06 LAB — COMPREHENSIVE METABOLIC PANEL
ALBUMIN: 3.9 g/dL (ref 3.5–4.7)
ALK PHOS: 36 IU/L — AB (ref 39–117)
ALT: 13 IU/L (ref 0–32)
AST: 15 IU/L (ref 0–40)
Albumin/Globulin Ratio: 1.5 (ref 1.2–2.2)
BILIRUBIN TOTAL: 0.4 mg/dL (ref 0.0–1.2)
BUN / CREAT RATIO: 29 — AB (ref 12–28)
BUN: 22 mg/dL (ref 8–27)
CO2: 24 mmol/L (ref 18–29)
CREATININE: 0.76 mg/dL (ref 0.57–1.00)
Calcium: 9.4 mg/dL (ref 8.7–10.3)
Chloride: 103 mmol/L (ref 96–106)
GFR calc Af Amer: 83 mL/min/{1.73_m2} (ref >59)
GFR calc non Af Amer: 72 mL/min/{1.73_m2} (ref >59)
Globulin, Total: 2.6 g/dL (ref 1.5–4.5)
Glucose: 146 mg/dL — ABNORMAL HIGH (ref 65–99)
Potassium: 4.6 mmol/L (ref 3.5–5.2)
SODIUM: 143 mmol/L (ref 134–144)
Total Protein: 6.5 g/dL (ref 6.0–8.5)

## 2017-02-06 LAB — TSH: TSH: 1.95 u[IU]/mL (ref 0.450–4.500)

## 2017-02-06 LAB — VITAMIN B12: Vitamin B-12: 415 pg/mL (ref 232–1245)

## 2017-02-22 HISTORY — PX: SKIN BIOPSY: SHX1

## 2017-04-24 ENCOUNTER — Other Ambulatory Visit: Payer: Self-pay | Admitting: Internal Medicine

## 2017-05-09 ENCOUNTER — Ambulatory Visit (INDEPENDENT_AMBULATORY_CARE_PROVIDER_SITE_OTHER): Payer: Medicare Other | Admitting: Adult Health

## 2017-05-09 ENCOUNTER — Encounter: Payer: Self-pay | Admitting: Adult Health

## 2017-05-09 VITALS — BP 152/56 | HR 66 | Ht 65.5 in | Wt 166.0 lb

## 2017-05-09 DIAGNOSIS — E1100 Type 2 diabetes mellitus with hyperosmolarity without nonketotic hyperglycemic-hyperosmolar coma (NKHHC): Secondary | ICD-10-CM

## 2017-05-09 DIAGNOSIS — Z0000 Encounter for general adult medical examination without abnormal findings: Secondary | ICD-10-CM | POA: Insufficient documentation

## 2017-05-09 DIAGNOSIS — I10 Essential (primary) hypertension: Secondary | ICD-10-CM

## 2017-05-09 LAB — POCT GLYCOSYLATED HEMOGLOBIN (HGB A1C): HEMOGLOBIN A1C: 6.6

## 2017-05-09 NOTE — Assessment & Plan Note (Signed)
Continue regular f/u with Dr. Angela BurkeHilty/Cards. Continue Toprol 25mg , HCTZ 12.5mg , Diltiazem 240mg  Benazepril 40mg -daily.

## 2017-05-09 NOTE — Progress Notes (Signed)
Subjective:    Patient ID: Sabrina [name], female    DOB: September 05, 1930, 83 y.o.   MRN: [id number]  HPI:  Sabrina [name] presents for f/u-diabetes, overall wellness. She is compliant on all medications, denies SE. She is taking Glipizide 2/5mg  daily and follows a diabetic diet consistently.  She is able to care for herself and her husband, perform ADLs/IADLs, and drives (safely).  She ambulates with a cane (when running errands) or rolling walker (at home). Her A1c today 6.6, and has been been this level at the last 2 A1c checks. She is followed by cards annually.  Patient Care Team    Relationship Specialty Notifications Start End  Thomasene Lotpalski, Deborah, DO PCP - General Family Medicine  08/04/16   Chrystie NoseHilty, Kenneth C, MD Consulting Physician Cardiology  08/04/16   Sheran Luzamos, Richard, MD Consulting Physician Physical Medicine and Rehabilitation  08/04/16   Venancio PoissonLomax, Laura, MD Consulting Physician Dermatology  08/04/16   Linus SalmonsMiller, Joseph K, MD Consulting Physician Neurology  08/05/16    Comment: [phone]    Patient Active Problem List   Diagnosis Date Noted  . Health care maintenance 05/09/2017  . Overweight (BMI 25.0-29.9) 08/05/2016  . Osteoporosis 08/05/2016  . Diverticular disease 08/05/2016  . Emotional stress 08/05/2016  . Insomnia 08/05/2016  . Vitamin D deficiency 08/05/2016  . Vitamin B12 deficiency without anemia 08/05/2016  . LBBB (left bundle branch block) 01/12/2016  . DOE (dyspnea on exertion) 01/12/2016  . Venous insufficiency of left leg 01/12/2015  . Alzheimer's disease of other onset 01/01/2014  . HTN (hypertension) 07/03/2013  . PVC (premature ventricular contraction) 07/03/2013  . Hyperlipidemia 07/03/2013  . DM2 (diabetes mellitus, type 2) - diet controlled 07/03/2013     Past Medical History:  Diagnosis Date  . Cognitive disorder    early dementia?  . Diverticular disease    L colon  . History of Doppler ultrasound 12/30/2007   carotid doppler; L Mid ICA 0-49% diameter  reduction, R & L subclavian artery 0-49% diameter reduction   . History of nuclear stress test 11/23/2006   dipyridamole; normal pattern of perfusion; low risk scan   . Hyperlipidemia   . Hypertension      Past Surgical History:  Procedure Laterality Date  . CARDIAC CATHETERIZATION  2001   non occlusive disease, 40% stenosis of LAD; LVH  . CATARACT EXTRACTION  04/2013, 05/2013   x2  . eyelid surgery  02/26/2003   r/t dematochalsis of upper eyelids  . POLYPECTOMY  06/2003  . SKIN BIOPSY  02/22/2017  . TRANSTHORACIC ECHOCARDIOGRAM  08/09/2011   EF>55%; mild concentric LVH; LA mildly dilated; mild MV prolapse; trace MR, mild TR, trace AV regurg, trace PV regurg     Family History  Problem Relation Age of Onset  . Hypertension Mother   . Stroke Mother   . Heart disease Father   . Diabetes Brother   . Cancer Sister   . Heart Problems Brother        MV prolapse  . Heart Problems Sister        MV prolapse  . Hypertension Sister        x2     History  Drug Use No     History  Alcohol Use No     History  Smoking Status  . Never Smoker  Smokeless Tobacco  . Never Used     Outpatient Encounter Prescriptions as of 05/09/2017  Medication Sig  . acetaminophen (TYLENOL) 650 MG CR tablet 650 mg  as needed.  Marland Kitchen alendronate (FOSAMAX) 70 MG tablet Take 1 tablet (70 mg total) by mouth every 7 (seven) days. Take with a full glass of water on an empty stomach.  . Ascorbic Acid (VITAMIN C PO) Take by mouth daily.  Marland Kitchen aspirin 81 MG EC tablet Take 81 mg by mouth daily. Swallow whole.  . benazepril (LOTENSIN) 40 MG tablet TAKE 1 TABLET BY MOUTH DAILY  . calcium carbonate (OS-CAL) 600 MG TABS tablet Take 600 mg by mouth daily with breakfast.   . cholecalciferol (VITAMIN D) 1000 units tablet Take 1,000 Units by mouth daily.  . Cyanocobalamin (VITAMIN B-12 PO) Take by mouth daily.  Marland Kitchen diltiazem (CARDIZEM CD) 240 MG 24 hr capsule TAKE 1 CAPSULE BY MOUTH DAILY.  Marland Kitchen donepezil (ARICEPT) 10 MG  tablet Take 10 mg by mouth at bedtime.  Marland Kitchen doxazosin (CARDURA) 2 MG tablet TAKE 1 TABLET BY MOUTH DAILY.  Marland Kitchen glipiZIDE (GLUCOTROL XL) 2.5 MG 24 hr tablet TAKE 1 TABLET (2.5 MG TOTAL) BY MOUTH DAILY.  . hydrochlorothiazide (MICROZIDE) 12.5 MG capsule TAKE 1 CAPSULE BY MOUTH DAILY.  . memantine (NAMENDA) 10 MG tablet Take 10 mg by mouth 2 (two) times daily. 1/2 TABLET QAM AND ONE TABLET QHS  . metoprolol succinate (TOPROL-XL) 25 MG 24 hr tablet TAKE 1 TABLET BY MOUTH DAILY.  Marland Kitchen Omega-3 Fatty Acids (FISH OIL) 1000 MG CAPS Take 3 capsules by mouth daily.  . pravastatin (PRAVACHOL) 20 MG tablet Take 20 mg by mouth daily.  . vitamin E (VITAMIN E) 400 UNIT capsule Take 400 Units by mouth daily.   No facility-administered encounter medications on file as of 05/09/2017.     Allergies: Codeine and Penicillins  Body mass index is 27.2 kg/m.  Blood pressure (!) 152/56, pulse 66, height 5' 5.5" (1.664 m), weight 166 lb (75.3 kg).    Review of Systems  Constitutional: Positive for fatigue. Negative for activity change, appetite change, chills, diaphoresis, fever and unexpected weight change.  Respiratory: Negative for cough, chest tightness, shortness of breath, wheezing and stridor.   Cardiovascular: Negative for chest pain, palpitations and leg swelling.  Musculoskeletal: Positive for arthralgias, back pain, gait problem, joint swelling, myalgias, neck pain and neck stiffness.  Skin: Negative for color change, pallor, rash and wound.  Allergic/Immunologic: Negative for immunocompromised state.  Neurological: Negative for dizziness, light-headedness and numbness.       Objective:   Physical Exam  Constitutional: She is oriented to person, place, and time. She appears well-developed and well-nourished. No distress.  Cardiovascular: Normal rate, regular rhythm, normal heart sounds and intact distal pulses.   No murmur heard. Pulmonary/Chest: Effort normal and breath sounds normal. No respiratory  distress. She has no wheezes. She has no rales. She exhibits no tenderness.  Neurological: She is alert and oriented to person, place, and time.  Skin: Skin is warm and dry. No rash noted. She is not diaphoretic. No erythema. No pallor.  Psychiatric: She has a normal mood and affect. Her behavior is normal. Judgment and thought content normal.  Nursing note and vitals reviewed.         Assessment & Plan:   1. Type 2 diabetes mellitus with hyperosmolarity without coma, without long-term current use of insulin (HCC)   2. Essential hypertension   3. Health care maintenance     DM2 (diabetes mellitus, type 2) - diet controlled A1c 6.6 Last two A1cs have been 6.6 Continue Diabetic Diet and Glipizide 2.5mg  QD F/u every 6 months since BS  is so well controlled.    HTN (hypertension) Continue regular f/u with Dr. Hilty/Cards. Continue Toprol 25mg , HCTZ 12.5mg , Diltiazem 240mg  Benazepril 40mg -daily.   Health care maintenance Denies need for medication RFs today.    FOLLOW-UP:  Return in about 6 months (around 11/08/2017) for Regular Follow Up, Diabetes.

## 2017-05-09 NOTE — Patient Instructions (Signed)
Diabetes Mellitus and Food It is important for you to manage your blood sugar (glucose) level. Your blood glucose level can be greatly affected by what you eat. Eating healthier foods in the appropriate amounts throughout the day at about the same time each day will help you control your blood glucose level. It can also help slow or prevent worsening of your diabetes mellitus. Healthy eating may even help you improve the level of your blood pressure and reach or maintain a healthy weight. General recommendations for healthful eating and cooking habits include:  Eating meals and snacks regularly. Avoid going long periods of time without eating to lose weight.  Eating a diet that consists mainly of plant-based foods, such as fruits, vegetables, nuts, legumes, and whole grains.  Using low-heat cooking methods, such as baking, instead of high-heat cooking methods, such as deep frying.  Work with your dietitian to make sure you understand how to use the Nutrition Facts information on food labels. How can food affect me? Carbohydrates Carbohydrates affect your blood glucose level more than any other type of food. Your dietitian will help you determine how many carbohydrates to eat at each meal and teach you how to count carbohydrates. Counting carbohydrates is important to keep your blood glucose at a healthy level, especially if you are using insulin or taking certain medicines for diabetes mellitus. Alcohol Alcohol can cause sudden decreases in blood glucose (hypoglycemia), especially if you use insulin or take certain medicines for diabetes mellitus. Hypoglycemia can be a life-threatening condition. Symptoms of hypoglycemia (sleepiness, dizziness, and disorientation) are similar to symptoms of having too much alcohol. If your health care provider has given you approval to drink alcohol, do so in moderation and use the following guidelines:  Women should not have more than one drink per day, and men  should not have more than two drinks per day. One drink is equal to: ? 12 oz of beer. ? 5 oz of wine. ? 1 oz of hard liquor.  Do not drink on an empty stomach.  Keep yourself hydrated. Have water, diet soda, or unsweetened iced tea.  Regular soda, juice, and other mixers might contain a lot of carbohydrates and should be counted.  What foods are not recommended? As you make food choices, it is important to remember that all foods are not the same. Some foods have fewer nutrients per serving than other foods, even though they might have the same number of calories or carbohydrates. It is difficult to get your body what it needs when you eat foods with fewer nutrients. Examples of foods that you should avoid that are high in calories and carbohydrates but low in nutrients include:  Trans fats (most processed foods list trans fats on the Nutrition Facts label).  Regular soda.  Juice.  Candy.  Sweets, such as cake, pie, doughnuts, and cookies.  Fried foods.  What foods can I eat? Eat nutrient-rich foods, which will nourish your body and keep you healthy. The food you should eat also will depend on several factors, including:  The calories you need.  The medicines you take.  Your weight.  Your blood glucose level.  Your blood pressure level.  Your cholesterol level.  You should eat a variety of foods, including:  Protein. ? Lean cuts of meat. ? Proteins low in saturated fats, such as fish, egg whites, and beans. Avoid processed meats.  Fruits and vegetables. ? Fruits and vegetables that may help control blood glucose levels, such as apples,   mangoes, and yams.  Dairy products. ? Choose fat-free or low-fat dairy products, such as milk, yogurt, and cheese.  Grains, bread, pasta, and rice. ? Choose whole grain products, such as multigrain bread, whole oats, and brown rice. These foods may help control blood pressure.  Fats. ? Foods containing healthful fats, such as  nuts, avocado, olive oil, canola oil, and fish.  Does everyone with diabetes mellitus have the same meal plan? Because every person with diabetes mellitus is different, there is not one meal plan that works for everyone. It is very important that you meet with a dietitian who will help you create a meal plan that is just right for you. This information is not intended to replace advice given to you by your health care provider. Make sure you discuss any questions you have with your health care provider. Document Released: 08/10/2005 Document Revised: 04/20/2016 Document Reviewed: 10/10/2013 Elsevier Interactive Patient Education  2017 [employer]  Continue all medications as directed. Since your blood sugar is so well controlled, follow-up in 6 months or sooner if needed. Continue with other specialists as directed.

## 2017-05-09 NOTE — Assessment & Plan Note (Signed)
Denies need for medication RFs today.

## 2017-05-09 NOTE — Assessment & Plan Note (Signed)
A1c 6.6 Last two A1cs have been 6.6 Continue Diabetic Diet and Glipizide 2.5mg  QD F/u every 6 months since BS is so well controlled.

## 2017-05-18 ENCOUNTER — Telehealth: Payer: Self-pay

## 2017-05-18 MED ORDER — ZOSTER VAC RECOMB ADJUVANTED 50 MCG/0.5ML IM SUSR: 0.5000 mL | Freq: Once | INTRAMUSCULAR | 0 refills | Status: AC

## 2017-05-18 NOTE — Telephone Encounter (Signed)
Patient came to office requesting shingrix vaccine rx be sent to [facility].  Rx sent to pharmacy.  Patient aware.

## 2017-05-21 ENCOUNTER — Other Ambulatory Visit: Payer: Self-pay | Admitting: Internal Medicine

## 2017-05-22 ENCOUNTER — Encounter: Payer: Self-pay | Admitting: Neurology

## 2017-05-22 ENCOUNTER — Telehealth: Payer: Self-pay | Admitting: Family Medicine

## 2017-05-22 DIAGNOSIS — G309 Alzheimer's disease, unspecified: Principal | ICD-10-CM

## 2017-05-22 DIAGNOSIS — F0281 Dementia in other diseases classified elsewhere with behavioral disturbance: Secondary | ICD-10-CM

## 2017-05-22 NOTE — Telephone Encounter (Signed)
Patient called stating that her just found out her Neuro office in [place] will be closing in the next month. [facility] [place] [facility] [place] and she would rather just start with new practice in [place] that is part of [facility] so that all her providers will be [facility] affiliated.

## 2017-05-22 NOTE — Addendum Note (Signed)
Addended by: Judd GaudierLEVENS, SHANNON M on: 05/22/2017 12:11 PM   Modules accepted: Orders

## 2017-05-22 NOTE — Telephone Encounter (Signed)
Referral for neurology placed

## 2017-05-25 LAB — HM DIABETES EYE EXAM

## 2017-06-26 ENCOUNTER — Encounter: Payer: Self-pay | Admitting: Neurology

## 2017-06-26 ENCOUNTER — Ambulatory Visit (INDEPENDENT_AMBULATORY_CARE_PROVIDER_SITE_OTHER): Payer: Medicare Other | Admitting: Neurology

## 2017-06-26 VITALS — BP 136/54 | HR 81 | Ht 64.0 in | Wt 168.0 lb

## 2017-06-26 DIAGNOSIS — F0280 Dementia in other diseases classified elsewhere without behavioral disturbance: Secondary | ICD-10-CM | POA: Diagnosis not present

## 2017-06-26 DIAGNOSIS — G301 Alzheimer's disease with late onset: Secondary | ICD-10-CM | POA: Diagnosis not present

## 2017-06-26 MED ORDER — MEMANTINE HCL 10 MG PO TABS: ORAL_TABLET | ORAL | 3 refills | Status: DC

## 2017-06-26 MED ORDER — DONEPEZIL HCL 10 MG PO TABS: 10.0000 mg | ORAL_TABLET | Freq: Every day | ORAL | 3 refills | Status: DC

## 2017-06-26 NOTE — Patient Instructions (Addendum)
1. Continue Aricept 10mg  daily and Namenda 10mg , take 1/2 tablet in AM, 1 tablet in PM 2. Control of blood pressure, cholesterol, as well as physical exercise and brain stimulation exercises are important for brain health 3. Follow-up in 1 year, call for any changes

## 2017-06-26 NOTE — Progress Notes (Signed)
NEUROLOGY CONSULTATION NOTE  Sabrina Dowsenne C Napolitano MRN: [id number] DOB: 02-14-1930  Referring provider: Dr. Thomasene Loteborah Opalski Primary care provider: Dr. Thomasene Loteborah Opalski  Reason for consult:  Establish care for dementia  Dear Dr Sharee Holsterpalski:  Thank you for your kind referral of Sabrina [name] for consultation of the above symptoms. Although her history is well known to you, please allow me to reiterate it for the purpose of our medical record. The patient was alone in the office today. Records and images were personally reviewed where available.  HISTORY OF PRESENT ILLNESS: This is a very pleasant 83 year old right-handed woman with a history of hypertension, hyperlipidemia, diabetes, and diagnosis of Late Onset Alzheimer's dementia, presenting to establish care. She thinks her memory is pretty good since she has been taking her medications. She reports that her daughter was murdered by her son-in-law in 2005. After this time, her other daughter told her that she and her sister had been noticing memory changes and had wanted her to see a neurologist. She started seeing neurologist Dr. Adella HareApplegate in 2005 and was started on Aricept, then Namenda added in 2006. She cannot recall why she was prescribed Namenda 10mg  1/2 tablet in AM, 1 tablet in PM, but feels it may have been from side effects. After Dr. Adella HareApplegate left the practice, she started seeing neurologist Dr. Hyacinth MeekerMiller, who is also leaving the practice, hence she is coming to establish care here.  She lives with her husband. Her daughter tells her that she is not the same person she was before the medications were started. She is in charge of finances and denies any missed bills. She fills her pillbox weekly and rarely forgets medications. She denies getting lost driving. She denies misplacing things frequently or leaving the stove on. She denies being told she repeats herself or of any personality changes. She denies any hallucinations or REM behavior  disorder. No difficulties with ADLs.   She denies any headaches, dizziness, diplopia, dysarthria/dysphagia, neck/back pain, focal numbness/tingling/weakness, bowel/bladder dysfunction, anosmia, or tremors. She has arthritis with a bad right knee and left ankle. She denies any falls. No family history of dementia. No history of significant head injuries or alcohol use.    PAST MEDICAL HISTORY: Past Medical History:  Diagnosis Date  . Cognitive disorder    early dementia?  . Diverticular disease    L colon  . History of Doppler ultrasound 12/30/2007   carotid doppler; L Mid ICA 0-49% diameter reduction, R & L subclavian artery 0-49% diameter reduction   . History of nuclear stress test 11/23/2006   dipyridamole; normal pattern of perfusion; low risk scan   . Hyperlipidemia   . Hypertension     PAST SURGICAL HISTORY: Past Surgical History:  Procedure Laterality Date  . CARDIAC CATHETERIZATION  2001   non occlusive disease, 40% stenosis of LAD; LVH  . CATARACT EXTRACTION  04/2013, 05/2013   x2  . eyelid surgery  02/26/2003   r/t dematochalsis of upper eyelids  . POLYPECTOMY  06/2003  . SKIN BIOPSY  02/22/2017  . TRANSTHORACIC ECHOCARDIOGRAM  08/09/2011   EF>55%; mild concentric LVH; LA mildly dilated; mild MV prolapse; trace MR, mild TR, trace AV regurg, trace PV regurg    MEDICATIONS: Current Outpatient Prescriptions on File Prior to Visit  Medication Sig Dispense Refill  . acetaminophen (TYLENOL) 650 MG CR tablet 650 mg as needed.    Marland Kitchen. alendronate (FOSAMAX) 70 MG tablet Take 1 tablet (70 mg total) by mouth every 7 (seven)  days. Take with a full glass of water on an empty stomach. 4 tablet 11  . Ascorbic Acid (VITAMIN C PO) Take by mouth daily.    Marland Kitchen. aspirin 81 MG EC tablet Take 81 mg by mouth daily. Swallow whole.    . benazepril (LOTENSIN) 40 MG tablet TAKE 1 TABLET BY MOUTH DAILY 90 tablet 3  . calcium carbonate (OS-CAL) 600 MG TABS tablet Take 600 mg by mouth daily with breakfast.      . cholecalciferol (VITAMIN D) 1000 units tablet Take 1,000 Units by mouth daily.    . Cyanocobalamin (VITAMIN B-12 PO) Take by mouth daily.    Marland Kitchen. diltiazem (CARDIZEM CD) 240 MG 24 hr capsule TAKE 1 CAPSULE BY MOUTH DAILY. 90 capsule 2  . donepezil (ARICEPT) 10 MG tablet Take 10 mg by mouth at bedtime.    Marland Kitchen. doxazosin (CARDURA) 2 MG tablet TAKE 1 TABLET BY MOUTH DAILY. 90 tablet 3  . glipiZIDE (GLUCOTROL XL) 2.5 MG 24 hr tablet TAKE 1 TABLET (2.5 MG TOTAL) BY MOUTH DAILY. 90 tablet 0  . hydrochlorothiazide (MICROZIDE) 12.5 MG capsule TAKE 1 CAPSULE BY MOUTH DAILY. 90 capsule 2  . memantine (NAMENDA) 10 MG tablet Take 10 mg by mouth 2 (two) times daily. 1/2 TABLET QAM AND ONE TABLET QHS    . metoprolol succinate (TOPROL-XL) 25 MG 24 hr tablet TAKE 1 TABLET BY MOUTH DAILY. 90 tablet 2  . Omega-3 Fatty Acids (FISH OIL) 1000 MG CAPS Take 3 capsules by mouth daily.    . pravastatin (PRAVACHOL) 20 MG tablet Take 20 mg by mouth daily.    . vitamin E (VITAMIN E) 400 UNIT capsule Take 400 Units by mouth daily.     No current facility-administered medications on file prior to visit.     ALLERGIES: Allergies  Allergen Reactions  . Codeine   . Penicillins     FAMILY HISTORY: Family History  Problem Relation Age of Onset  . Hypertension Mother   . Stroke Mother   . Heart disease Father   . Diabetes Brother   . Cancer Sister   . Heart Problems Brother        MV prolapse  . Heart Problems Sister        MV prolapse  . Hypertension Sister        x2    SOCIAL HISTORY: Social History   Social History  . Marital status: Single    Spouse name: N/A  . Number of children: 2  . Years of education: N/A   Occupational History  . Not on file.   Social History Main Topics  . Smoking status: Never Smoker  . Smokeless tobacco: Never Used  . Alcohol use No  . Drug use: No  . Sexual activity: Not Currently   Other Topics Concern  . Not on file   Social History Narrative  . No narrative  on file    REVIEW OF SYSTEMS: Constitutional: No fevers, chills, or sweats, no generalized fatigue, change in appetite Eyes: No visual changes, double vision, eye pain Ear, nose and throat: No hearing loss, ear pain, nasal congestion, sore throat Cardiovascular: No chest pain, palpitations Respiratory:  No shortness of breath at rest or with exertion, wheezes GastrointestinaI: No nausea, vomiting, diarrhea, abdominal pain, fecal incontinence Genitourinary:  No dysuria, urinary retention or frequency Musculoskeletal:  No neck pain, back pain Integumentary: No rash, pruritus, skin lesions Neurological: as above Psychiatric: No depression, insomnia, anxiety Endocrine: No palpitations, fatigue, diaphoresis, mood swings,  change in appetite, change in weight, increased thirst Hematologic/Lymphatic:  No anemia, purpura, petechiae. Allergic/Immunologic: no itchy/runny eyes, nasal congestion, recent allergic reactions, rashes  PHYSICAL EXAM: Vitals:   06/26/17 0839  BP: (!) 136/54  Pulse: 81   General: No acute distress Head:  Normocephalic/atraumatic Eyes: Fundoscopic exam shows bilateral sharp discs, no vessel changes, exudates, or hemorrhages Neck: supple, no paraspinal tenderness, full range of motion Back: No paraspinal tenderness Heart: regular rate and rhythm Lungs: Clear to auscultation bilaterally. Vascular: No carotid bruits. Skin/Extremities: No rash, no edema Neurological Exam: Mental status: alert and oriented to person, place, and time, no dysarthria or aphasia, Fund of knowledge is appropriate.  Recent and remote memory are intact.  Attention and concentration are normal.    Able to name objects and repeat phrases.  Montreal Cognitive Assessment  06/26/2017  Visuospatial/ Executive (0/5) 4  Naming (0/3) 2  Attention: Read list of digits (0/2) 2  Attention: Read list of letters (0/1) 1  Attention: Serial 7 subtraction starting at 100 (0/3) 3  Language: Repeat phrase  (0/2) 2  Language : Fluency (0/1) 1  Abstraction (0/2) 2  Delayed Recall (0/5) 4  Orientation (0/6) 6  Total 27   Cranial nerves: CN I: not tested CN II: pupils equal, round and reactive to light, visual fields intact, fundi unremarkable. CN III, IV, VI:  full range of motion, no nystagmus, no ptosis CN V: facial sensation intact CN VII: upper and lower face symmetric CN VIII: hearing intact to finger rub CN IX, X: gag intact, uvula midline CN XI: sternocleidomastoid and trapezius muscles intact CN XII: tongue midline Bulk & Tone: minimal left cogwheeling with distraction, no fasciculations. Motor: 5/5 throughout with no pronator drift. Sensation: intact to light touch, cold, pin, vibration and joint position sense.  No extinction to double simultaneous stimulation.  Romberg test negatve Deep Tendon Reflexes: +2 throughout, no ankle clonus Plantar responses: downgoing bilaterally Cerebellar: no incoordination on finger to nose, heel to shin. No dysdiadochokinesia Gait: slow and cautious with walker, no ataxia Tremor: no resting tremor, mild high frequency low amplitude postural and endpoint tremor  IMPRESSION: This is a pleasant 83 year old right-handed woman with vascular risk factors including hypertension, hyperlipidemia, diabetes, and a diagnosis of Late Onset Alzheimer's disease since she started seeing her previous neurologist in 2005, presenting to establish care. She has been taking Aricept and Namenda for more than 10 years with no side effects. She reports her family feels she is doing good. MOCA score today normal 27/30. We discussed the importance of control of vascular risk factors, physical exercise, and brain stimulation exercises for brain health. Continue to monitor driving. Refills for her medications were sent. She will follow-up in 1 year and knows to call for any changes.   Thank you for allowing me to participate in the care of this patient. Please do not hesitate  to call for any questions or concerns.   Patrcia Dolly, M.D.  CC: Dr. Sharee Holster

## 2017-07-03 ENCOUNTER — Other Ambulatory Visit: Payer: Self-pay | Admitting: Internal Medicine

## 2017-07-03 ENCOUNTER — Other Ambulatory Visit: Payer: Self-pay | Admitting: Family Medicine

## 2017-08-28 ENCOUNTER — Other Ambulatory Visit: Payer: Self-pay

## 2017-08-28 ENCOUNTER — Other Ambulatory Visit: Payer: Self-pay | Admitting: *Deleted

## 2017-08-28 MED ORDER — PRAVASTATIN SODIUM 20 MG PO TABS: 20.0000 mg | ORAL_TABLET | Freq: Every day | ORAL | 0 refills | Status: DC

## 2017-09-16 IMAGING — NM NM MISC PROCEDURE
6 series · 36 of 36 positions shown · non-contrast
Comparison: none

[Series 1: wbr rest · 6.40mm/px · 6 of 64 frames shown]
[frame 6/64]
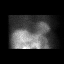
[frame 16/64]
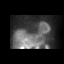
[frame 27/64]
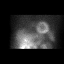
[frame 38/64]
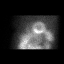
[frame 48/64]
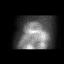
[frame 59/64]
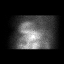

[Series 1: wbr_r-proj_st wbr rest · 6.40mm/px · 6 of 64 frames shown]
[frame 6/64]
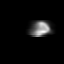
[frame 16/64]
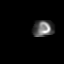
[frame 27/64]
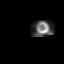
[frame 38/64]
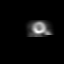
[frame 48/64]
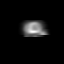
[frame 59/64]
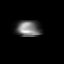

[Series 2: wbr_s-proj_st wbr stress-gsp · 6.40mm/px · 6 of 512 frames shown]
[frame 43/512]
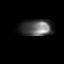
[frame 128/512]
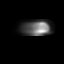
[frame 214/512]
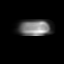
[frame 299/512]
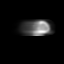
[frame 384/512]
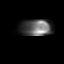
[frame 470/512]
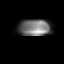

[Series 2: wbr stress-gsp · 6.40mm/px · 6 of 511 frames shown]
[frame 43/511]
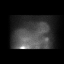
[frame 128/511]
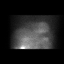
[frame 213/511]
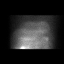
[frame 298/511]
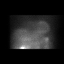
[frame 383/511]
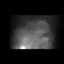
[frame 469/511]
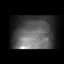

[Series 3: wbr_s-proj_st wbr stress-sum-em · 6.40mm/px · 6 of 64 frames shown]
[frame 6/64]
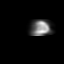
[frame 16/64]
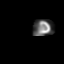
[frame 27/64]
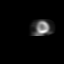
[frame 38/64]
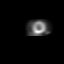
[frame 48/64]
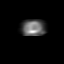
[frame 59/64]
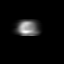

[Series 3: wbr stress-sum-em · 6.40mm/px · 6 of 64 frames shown]
[frame 6/64]
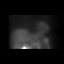
[frame 16/64]
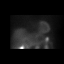
[frame 27/64]
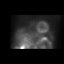
[frame 38/64]
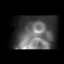
[frame 48/64]
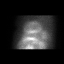
[frame 59/64]
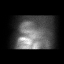

[36 of 36 positions shown; findings below may reference images not displayed]

Canned report from images found in remote index.

Refer to host system for actual result text.

## 2017-11-08 ENCOUNTER — Ambulatory Visit: Payer: Medicare Other | Admitting: Family Medicine

## 2018-01-03 ENCOUNTER — Other Ambulatory Visit: Payer: Self-pay | Admitting: Family Medicine

## 2018-01-07 ENCOUNTER — Telehealth: Payer: Self-pay | Admitting: Family Medicine

## 2018-01-07 NOTE — Telephone Encounter (Signed)
Noted MPulliam, CMA/RT(R)  

## 2018-01-07 NOTE — Telephone Encounter (Signed)
Patient and spouse moved into Assisted Living facility in [place]---Has New PCP .--glh

## 2018-01-08 ENCOUNTER — Encounter: Payer: Self-pay | Admitting: Internal Medicine

## 2018-01-08 ENCOUNTER — Ambulatory Visit: Payer: Medicare Other | Admitting: Internal Medicine

## 2018-01-08 VITALS — BP 118/40 | HR 69 | Ht 64.0 in | Wt 161.0 lb

## 2018-01-08 DIAGNOSIS — I351 Nonrheumatic aortic (valve) insufficiency: Secondary | ICD-10-CM | POA: Diagnosis not present

## 2018-01-08 DIAGNOSIS — I493 Ventricular premature depolarization: Secondary | ICD-10-CM | POA: Diagnosis not present

## 2018-01-08 DIAGNOSIS — R0609 Other forms of dyspnea: Secondary | ICD-10-CM | POA: Diagnosis not present

## 2018-01-08 DIAGNOSIS — I447 Left bundle-branch block, unspecified: Secondary | ICD-10-CM

## 2018-01-08 MED ORDER — BENAZEPRIL HCL 20 MG PO TABS: 20.0000 mg | ORAL_TABLET | Freq: Every day | ORAL | 3 refills | Status: DC

## 2018-01-08 NOTE — Progress Notes (Signed)
OFFICE NOTE  Chief Complaint:  No complaints  Primary Care Physician: Olive Bass, MD  HPI:  Sabrina [name] is a pleasant 84 year old female. She has had a history of asymptomatic nonsustained ventricular tachycardia. She has had a cardiac catheterization performed in 2001 that showed only LVH. She had a nuclear study in 2007 that was negative for ischemia. She continues to have no awareness of any arrhythmias.  She is unlimited from a physical standpoint. She has no ankle edema, no unusual breathlessness. She is diabetic and has a history of dyslipidemia and hypertension. Just has mild carotid artery disease. She had carotid Dopplers in 2009 that showed less than 50% narrowing on the left and none on the right. She had been seen by Dr. Adella Hare at the [facility] in [facility]. The concern with her family history of Alzheimer's is that she may be at risk for this. She has basically been on Namenda and Aricept for this. She does have some memory issues but according to her this is more preventative. I think she probably has some early issues with dementia. Otherwise she has no other complaints. He occasionally has some swelling of the left leg, but he injured that in the past.  I saw Sabrina [name] today in the office. She is doing well and is not bothered by PVCs. There is small amount of swelling in her left ankle. She's concerned about venous or arterial insufficiency. She reports her memory is pretty stable and saw our neurologist recently he was pleased with out she was doing.  Sabrina [name] returns today for follow-up. She denies any chest pain but has had some worsening shortness of breath. Her EKG today however shows a new left bundle branch block. She previously had a mild interventricular conduction delay. She certainly has risk factors for coronary artery disease and her last stress test was about 8 years ago. She's also had PVCs for which she is not particularly bothered.  She's been under a lot of stress recently with her husband has been in and out of the hospital.  01/24/2017  Sabrina [name] was seen today in follow-up. She denies any new chest pain or worsening shortness of breath. Blood pressure appears to be well-controlled today on the diastolic was low she reported seeing her neurologist a few days ago and her blood pressure is 127/80. She denies any lightheadedness, fatigue or other presyncopal symptoms. EKG again shows frequent PVCs although she is asymptomatic with this. We have not had success with trying to suppress these before with increasing doses of beta blocker. She is overdue for lab work and her primary care provider had ordered a full cholesterol will follow as well as some other labs in September that were not yet obtained.  01/08/2018  Sabrina [name] was seen today in follow-up.  She is fatigued from time to time but denies chest pain or worsening shortness of breath.  She is noted to have a low diastolic blood pressure today of 40.  She says from time to time that happens.  She is on medications for dementia.  She also was recently placed on low-dose glipizide and medication for osteo-porosis.  PMHx:  Past Medical History:  Diagnosis Date  . Cognitive disorder    early dementia?  . Diverticular disease    L colon  . History of Doppler ultrasound 12/30/2007   carotid doppler; L Mid ICA 0-49% diameter reduction, R & L subclavian artery 0-49% diameter reduction   . History  of nuclear stress test 11/23/2006   dipyridamole; normal pattern of perfusion; low risk scan   . Hyperlipidemia   . Hypertension     Past Surgical History:  Procedure Laterality Date  . CARDIAC CATHETERIZATION  2001   non occlusive disease, 40% stenosis of LAD; LVH  . CATARACT EXTRACTION  04/2013, 05/2013   x2  . eyelid surgery  02/26/2003   r/t dematochalsis of upper eyelids  . POLYPECTOMY  06/2003  . SKIN BIOPSY  02/22/2017  . TRANSTHORACIC ECHOCARDIOGRAM  08/09/2011    EF>55%; mild concentric LVH; LA mildly dilated; mild MV prolapse; trace MR, mild TR, trace AV regurg, trace PV regurg    FAMHx:  Family History  Problem Relation Age of Onset  . Hypertension Mother   . Stroke Mother   . Heart disease Father   . Diabetes Brother   . Cancer Sister   . Heart Problems Brother        MV prolapse  . Heart Problems Sister        MV prolapse  . Hypertension Sister        x2    SOCHx:   reports that  has never smoked. she has never used smokeless tobacco. She reports that she does not drink alcohol or use drugs.  ALLERGIES:  Allergies  Allergen Reactions  . Codeine   . Penicillins   . Tramadol Other (See Comments)    ROS: Pertinent items noted in HPI and remainder of comprehensive ROS otherwise negative.  HOME MEDS: Current Outpatient Medications  Medication Sig Dispense Refill  . acetaminophen (TYLENOL) 650 MG CR tablet 650 mg as needed.    Marland Kitchen. alendronate (FOSAMAX) 70 MG tablet Take 1 tablet (70 mg total) by mouth every 7 (seven) days. Take with a full glass of water on an empty stomach. 4 tablet 11  . Ascorbic Acid (VITAMIN C PO) Take by mouth daily.    Marland Kitchen. aspirin 81 MG EC tablet Take 81 mg by mouth daily. Swallow whole.    . benazepril (LOTENSIN) 40 MG tablet TAKE 1 TABLET BY MOUTH DAILY 90 tablet 3  . calcium carbonate (OS-CAL) 600 MG TABS tablet Take 600 mg by mouth daily with breakfast.     . Cholecalciferol (VITAMIN D PO) Take 5,000 Units by mouth daily.     . Cyanocobalamin (VITAMIN B-12 PO) Take by mouth daily.    Marland Kitchen. diltiazem (CARDIZEM CD) 240 MG 24 hr capsule TAKE 1 CAPSULE BY MOUTH DAILY. 90 capsule 3  . donepezil (ARICEPT) 10 MG tablet Take 1 tablet (10 mg total) by mouth at bedtime. 90 tablet 3  . doxazosin (CARDURA) 2 MG tablet TAKE 1 TABLET BY MOUTH DAILY. 90 tablet 3  . glipiZIDE (GLUCOTROL XL) 2.5 MG 24 hr tablet Take 1 tablet (2.5 mg total) by mouth daily. Patient needs office visit for additional refills 90 tablet 0  .  hydrochlorothiazide (MICROZIDE) 12.5 MG capsule TAKE 1 CAPSULE BY MOUTH DAILY. 90 capsule 3  . memantine (NAMENDA) 10 MG tablet Take 1/2 tablet in AM, 1 tablet in PM 135 tablet 3  . metoprolol succinate (TOPROL-XL) 25 MG 24 hr tablet TAKE 1 TABLET BY MOUTH DAILY. 90 tablet 3  . Omega-3 Fatty Acids (FISH OIL) 1000 MG CAPS Take 3 capsules by mouth daily.    . pravastatin (PRAVACHOL) 20 MG tablet Take 1 tablet (20 mg total) by mouth daily. 90 tablet 0  . vitamin E (VITAMIN E) 400 UNIT capsule Take 400 Units by mouth daily.  No current facility-administered medications for this visit.     LABS/IMAGING: No results found for this or any previous visit (from the past 48 hour(s)). No results found.  VITALS: BP (!) 118/40   Pulse 69   Ht 5\' 4"  (1.626 m)   Wt 161 lb (73 kg)   BMI 27.64 kg/m   EXAM: General appearance: alert and no distress Neck: no adenopathy, no carotid bruit, no JVD, supple, symmetrical, trachea midline and thyroid not enlarged, symmetric, no tenderness/mass/nodules Lungs: clear to auscultation bilaterally Heart: regular rate and rhythm, S1, S2 normal, no murmur, click, rub or gallop Abdomen: soft, non-tender; bowel sounds normal; no masses,  no organomegaly Extremities: edema 1+ left lower extremity edema Pulses: 2+ and symmetric Skin: Skin color, texture, turgor normal. No rashes or lesions Neurologic: Grossly normal  EKG: Sinus rhythm first-degree AV block at 69, LBBB-personally reviewed  ASSESSMENT: 1. LBBB 2. Aortic insufficiency 3. Hypertension 4. Asymptomatic PVCs 5. Hyperlipidemia 6. Diabetes 7. Cognitive impairment 8. Venous insufficiency  PLAN: 1.  Sabrina [name] was noted to have a widened pulse pressure today with a low diastolic pressure 40.  On exam she has aortic insufficiency which sounds more significant than her echo in 2012 which was only trace.  She has had some worsening fatigue and I am concerned about her risk of possible syncopal  episode with low diastolic pressures.  I would advise decreasing her benazepril from 40 to 20 mg, to allow a higher diastolic pressure.  Follow-up with me in 1 year or sooner as necessary.  [name], MD, [facility], FACP  [place]  [facility]  Medical Director of the Advanced Lipid Disorders &  Cardiovascular Risk Reduction Clinic Diplomate of the American Board of Clinical Lipidology Attending Cardiologist  Direct Dial: [phone]  Fax: [phone]  Website:  [email or web address] 01/08/2018, 2:39 PM

## 2018-01-08 NOTE — Patient Instructions (Signed)
Your physician has recommended you make the following change in your medication:  -- DECREASE benazepril to 20mg  once daily  Your physician wants you to follow-up in: ONE YEAR with Dr. Rennis GoldenHilty. You will receive a reminder letter in the mail two months in advance. If you don't receive a letter, please call our office to schedule the follow-up appointment.

## 2018-03-07 ENCOUNTER — Other Ambulatory Visit: Payer: Self-pay | Admitting: Internal Medicine

## 2018-03-07 NOTE — Telephone Encounter (Signed)
REFILL 

## 2018-03-07 NOTE — Telephone Encounter (Signed)
New Message:     *STAT* If patient is at the pharmacy, call can be transferred to refill team.   1. Which medications need to be refilled? (please list name of each medication and dose if known) pravastatin (PRAVACHOL) 20 MG tablet  2. Which pharmacy/location (including street and city if local pharmacy) is medication to be sent to?[facility] - [place] - [address]  3. Do they need a 30 day or 90 day supply? 30    Pharmacy states they sent us a refill request last week.

## 2018-04-01 ENCOUNTER — Other Ambulatory Visit: Payer: Self-pay | Admitting: Internal Medicine

## 2018-04-01 NOTE — Telephone Encounter (Signed)
REFILL 

## 2018-04-24 ENCOUNTER — Other Ambulatory Visit: Payer: Self-pay | Admitting: Neurology

## 2018-04-24 DIAGNOSIS — F0280 Dementia in other diseases classified elsewhere without behavioral disturbance: Secondary | ICD-10-CM

## 2018-04-24 DIAGNOSIS — G301 Alzheimer's disease with late onset: Principal | ICD-10-CM

## 2018-05-04 ENCOUNTER — Other Ambulatory Visit: Payer: Self-pay | Admitting: Internal Medicine

## 2018-06-23 ENCOUNTER — Other Ambulatory Visit: Payer: Self-pay | Admitting: Internal Medicine

## 2018-06-24 NOTE — Telephone Encounter (Signed)
Rx(s) sent to pharmacy electronically.  

## 2018-06-26 ENCOUNTER — Ambulatory Visit: Payer: Medicare Other | Admitting: Neurology

## 2018-06-28 ENCOUNTER — Telehealth: Payer: Self-pay | Admitting: Neurology

## 2018-06-28 NOTE — Telephone Encounter (Signed)
Daughter called and her mom had an appointment with Dr. Karel JarvisAquino last July and was to follow up in 1 year. Then appointment was cancelled by someone maybe from the facility she is living in that[place] in [place]. They are wanting to keep her care in [place] due to not having to go so far. They are not sure who cancelled the appointment but she was wanting to see if Dr. Karel JarvisAquino could continue to fill her medication? She uses [facility] [phone]. She [place] seeing [place]erough [place] at [facility] [phone]. Please Advise. Thanks

## 2018-07-01 NOTE — Telephone Encounter (Signed)
Returned call to daughter.  No answer.  No voicemail.  Unable to relay message below.

## 2018-07-01 NOTE — Telephone Encounter (Signed)
Dr. Karel JarvisAquino, you prescribe Namenda and Aricept.  There is no f/u scheduled.  OK to fill?

## 2018-07-01 NOTE — Telephone Encounter (Signed)
Pls let her know that we have to see patients once a year when we prescribe their medications to make sure there are no new issues with them. We can send refills until she speaks to Dr. Sol Passerough about it (can give 6 months refills), or she can ask Dr. Sol Passerough to continue the refills if they will be staying in [place]. Thanks

## 2018-07-02 ENCOUNTER — Other Ambulatory Visit: Payer: Self-pay | Admitting: Neurology

## 2018-07-02 ENCOUNTER — Telehealth: Payer: Self-pay | Admitting: Neurology

## 2018-07-02 DIAGNOSIS — F0280 Dementia in other diseases classified elsewhere without behavioral disturbance: Secondary | ICD-10-CM

## 2018-07-02 DIAGNOSIS — G301 Alzheimer's disease with late onset: Principal | ICD-10-CM

## 2018-07-02 MED ORDER — MEMANTINE HCL 10 MG PO TABS: ORAL_TABLET | ORAL | 1 refills | Status: AC

## 2018-07-02 MED ORDER — DONEPEZIL HCL 10 MG PO TABS: 10.0000 mg | ORAL_TABLET | Freq: Every day | ORAL | 1 refills | Status: AC

## 2018-07-02 NOTE — Telephone Encounter (Signed)
Called pt's daughter again.  LMOVM relaying Dr. Rosalyn GessAquino's message below.

## 2018-07-02 NOTE — Telephone Encounter (Signed)
Rx's for   Namenda 10mg  #180 with 1 refill Aricept 10mg  #90 with 1 refill  Sent to [facility].

## 2018-07-02 NOTE — Telephone Encounter (Signed)
Erin from [facility] called because the patient's daughter was needing to get her mom's Aricept medication refilled and there were no more refills on filed. She was wondering if we could send over a refill for that. Please send over prescription refill or call Erin at [phone]. Thanks.

## 2019-01-01 ENCOUNTER — Other Ambulatory Visit: Payer: Self-pay

## 2019-01-01 MED ORDER — HYDROCHLOROTHIAZIDE 12.5 MG PO CAPS: 12.5000 mg | ORAL_CAPSULE | Freq: Every day | ORAL | 0 refills | Status: DC

## 2019-01-01 NOTE — Telephone Encounter (Signed)
Rx(s) sent to pharmacy electronically.  

## 2019-01-08 ENCOUNTER — Ambulatory Visit (INDEPENDENT_AMBULATORY_CARE_PROVIDER_SITE_OTHER): Payer: Medicare Other | Admitting: Cardiology

## 2019-01-08 ENCOUNTER — Encounter: Payer: Self-pay | Admitting: Cardiology

## 2019-01-08 VITALS — BP 114/56 | HR 61 | Ht 64.5 in | Wt 164.6 lb

## 2019-01-08 DIAGNOSIS — I351 Nonrheumatic aortic (valve) insufficiency: Secondary | ICD-10-CM

## 2019-01-08 DIAGNOSIS — I447 Left bundle-branch block, unspecified: Secondary | ICD-10-CM

## 2019-01-08 DIAGNOSIS — I10 Essential (primary) hypertension: Secondary | ICD-10-CM | POA: Diagnosis not present

## 2019-01-08 DIAGNOSIS — I493 Ventricular premature depolarization: Secondary | ICD-10-CM | POA: Diagnosis not present

## 2019-01-08 DIAGNOSIS — R0609 Other forms of dyspnea: Secondary | ICD-10-CM

## 2019-01-08 NOTE — Progress Notes (Signed)
Cardiology Office Note:    Date:  01/08/2019   ID:  Sabrina [name], DOB 30-Apr-1930, MRN [id number]  PCP:  Olive Bass, MD  Cardiologist:  Gypsy Balsam, MD    Referring MD: Olive Bass, MD   Chief Complaint  Patient presents with  . Follow-up  Doing well  History of Present Illness:    Sabrina [name] is a 85 y.o. female with PVCs, left bundle branch block, hypertension, Arctic insufficiency.  Overall she seems to be doing well she still walks on the regular basis.  And she did not notice significant decrease in her ability to exercise as compared to a year ago.  Described to have some shortness of breath while exercising.  There is no dizziness no passing out no syncope no palpitations.  She denies having any swelling of lower extremities.  Past Medical History:  Diagnosis Date  . Cognitive disorder    early dementia?  . Diverticular disease    L colon  . History of Doppler ultrasound 12/30/2007   carotid doppler; L Mid ICA 0-49% diameter reduction, R & L subclavian artery 0-49% diameter reduction   . History of nuclear stress test 11/23/2006   dipyridamole; normal pattern of perfusion; low risk scan   . Hyperlipidemia   . Hypertension     Past Surgical History:  Procedure Laterality Date  . CARDIAC CATHETERIZATION  2001   non occlusive disease, 40% stenosis of LAD; LVH  . CATARACT EXTRACTION  04/2013, 05/2013   x2  . eyelid surgery  02/26/2003   r/t dematochalsis of upper eyelids  . POLYPECTOMY  06/2003  . SKIN BIOPSY  02/22/2017  . TRANSTHORACIC ECHOCARDIOGRAM  08/09/2011   EF>55%; mild concentric LVH; LA mildly dilated; mild MV prolapse; trace MR, mild TR, trace AV regurg, trace PV regurg    Current Medications: Current Meds  Medication Sig  . acetaminophen (TYLENOL) 650 MG CR tablet 650 mg as needed.  Marland Kitchen alendronate (FOSAMAX) 70 MG tablet Take 1 tablet (70 mg total) by mouth every 7 (seven) days. Take with a full glass of water on an empty stomach.  Marland Kitchen aspirin  81 MG EC tablet Take 81 mg by mouth daily. Swallow whole.  Marland Kitchen atorvastatin (LIPITOR) 20 MG tablet Take 20 mg by mouth daily.  . benazepril (LOTENSIN) 40 MG tablet Take 40 mg by mouth daily.  . calcium carbonate (OS-CAL) 600 MG TABS tablet Take 600 mg by mouth daily with breakfast.   . Cholecalciferol (VITAMIN D PO) Take 5,000 Units by mouth daily.   Marland Kitchen diltiazem (CARDIZEM CD) 240 MG 24 hr capsule TAKE 1 CAPSULE BY MOUTH DAILY.  Marland Kitchen donepezil (ARICEPT) 10 MG tablet Take 1 tablet (10 mg total) by mouth at bedtime.  Marland Kitchen doxazosin (CARDURA) 2 MG tablet TAKE ONE TABLET BY MOUTH EVERY DAY  . glipiZIDE (GLUCOTROL XL) 2.5 MG 24 hr tablet Take 1 tablet (2.5 mg total) by mouth daily. Patient needs office visit for additional refills  . hydrochlorothiazide (MICROZIDE) 12.5 MG capsule Take 1 capsule (12.5 mg total) by mouth daily. NEEDS APPOINTMENT FOR FUTURE REFILLS  . memantine (NAMENDA) 10 MG tablet TAKE ONE TABLET BY MOUTH TWICE DAILY (Patient taking differently: TAKE HALF TABLET IN THE AM AND ONE TABLET IN THE PM)  . metoprolol succinate (TOPROL-XL) 25 MG 24 hr tablet Take 1 tablet (25 mg total) by mouth daily.     Allergies:   Codeine; Penicillins; and Tramadol   Social History   Socioeconomic History  .  Marital status: Single    Spouse name: Not on file  . Number of children: 2  . Years of education: Not on file  . Highest education level: Not on file  Occupational History  . Not on file  Social Needs  . Financial resource strain: Not on file  . Food insecurity:    Worry: Not on file    Inability: Not on file  . Transportation needs:    Medical: Not on file    Non-medical: Not on file  Tobacco Use  . Smoking status: Never Smoker  . Smokeless tobacco: Never Used  Substance and Sexual Activity  . Alcohol use: No  . Drug use: No  . Sexual activity: Not Currently  Lifestyle  . Physical activity:    Days per week: Not on file    Minutes per session: Not on file  . Stress: Not on file    Relationships  . Social connections:    Talks on phone: Not on file    Gets together: Not on file    Attends religious service: Not on file    Active member of club or organization: Not on file    Attends meetings of clubs or organizations: Not on file    Relationship status: Not on file  Other Topics Concern  . Not on file  Social History Narrative  . Not on file     Family History: The patient's family history includes Cancer in her sister; Diabetes in her brother; Heart Problems in her brother and sister; Heart disease in her father; Hypertension in her mother and sister; Stroke in her mother. ROS:   Please see the history of present illness.    All 14 point review of systems negative except as described per history of present illness  EKGs/Labs/Other Studies Reviewed:      Recent Labs: No results found for requested labs within last 8760 hours.  Recent Lipid Panel    Component Value Date/Time   CHOL 127 01/26/2017 0812   TRIG 174 (H) 01/26/2017 0812   HDL 44 (L) 01/26/2017 0812   CHOLHDL 2.9 01/26/2017 0812   VLDL 35 (H) 01/26/2017 0812   LDLCALC 48 01/26/2017 0812    Physical Exam:    VS:  BP (!) 114/56   Pulse 61   Ht 5' 4.5" (1.638 m)   Wt 164 lb 9.6 oz (74.7 kg)   SpO2 97%   BMI 27.82 kg/m     Wt Readings from Last 3 Encounters:  01/08/19 164 lb 9.6 oz (74.7 kg)  01/08/18 161 lb (73 kg)  06/26/17 168 lb (76.2 kg)     GEN:  Well nourished, well developed in no acute distress HEENT: Normal NECK: No JVD; No carotid bruits LYMPHATICS: No lymphadenopathy CARDIAC: RRR, tones are very distant as I think there is a very soft diastolic murmur grade 1/6 best heard at the right border of the sternum, no rubs, no gallops RESPIRATORY:  Clear to auscultation without rales, wheezing or rhonchi  ABDOMEN: Soft, non-tender, non-distended MUSCULOSKELETAL:  No edema; No deformity  SKIN: Warm and dry LOWER EXTREMITIES: no swelling NEUROLOGIC:  Alert and oriented x  3 PSYCHIATRIC:  Normal affect   ASSESSMENT:    1. LBBB (left bundle branch block)   2. Essential hypertension   3. PVC (premature ventricular contraction)   4. Nonrheumatic aortic valve insufficiency   5. DOE (dyspnea on exertion)    PLAN:    In order of problems listed above:  1.  Left bundle branch block.  I will do EKG today to confirm the rhythm.  She will be scheduled to have an echocardiogram to look at her left ventricular function and size.  She does have aortic insufficiency and that is why is having more critical to look at the size of the left ventricle. 2. Essential hypertension actually blood pressure appears to be low she is on multiple blood pressure lowering medications I asked her to stop Cardura.  I also asked her to check blood pressure on the regular basis and bring results to me next time when I see her which will be in about 3 months. 3. PVCs.  She is controlled with beta-blocker as well as calcium channel blocker which we will continue.  Denies having any palpitations. 4. Aortic valve insufficiency.  Echocardiogram will be done to assess the valve. 5. Dyspnea on exertion echocardiogram will help to assess the problem   Medication Adjustments/Labs and Tests Ordered: Current medicines are reviewed at length with the patient today.  Concerns regarding medicines are outlined above.  No orders of the defined types were placed in this encounter.  Medication changes: No orders of the defined types were placed in this encounter.   Signed, Georgeanna Lea, MD, [facility] 01/08/2019 10:42 AM    [facility]

## 2019-01-08 NOTE — Patient Instructions (Signed)
Medication Instructions:  Your physician has recommended you make the following change in your medication:  Stop: Cardura   If you need a refill on your cardiac medications before your next appointment, please call your pharmacy.   Lab work: None.  If you have labs (blood work) drawn today and your tests are completely normal, you will receive your results only by: Marland Kitchen MyChart Message (if you have MyChart) OR . A paper copy in the mail If you have any lab test that is abnormal or we need to change your treatment, we will call you to review the results.  Testing/Procedures: Your physician has requested that you have an echocardiogram. Echocardiography is a painless test that uses sound waves to create images of your heart. It provides your doctor with information about the size and shape of your heart and how well your heart's chambers and valves are working. This procedure takes approximately one hour. There are no restrictions for this procedure.    Follow-Up: At [facility], you and your health needs are our priority.  As part of our continuing mission to provide you with exceptional heart care, we have created designated Provider Care Teams.  These Care Teams include your primary Cardiologist (physician) and Advanced Practice Providers (APPs -  Physician Assistants and Nurse Practitioners) who all work together to provide you with the care you need, when you need it. You will need a follow up appointment in 3 months.  Please call our office 2 months in advance to schedule this appointment.  You may see No primary care provider on file. or another member of our [employer] Provider Team in [place]: Norman Herrlich, MD . Belva Crome, MD  Any Other Special Instructions Will Be Listed Below (If Applicable).   Echocardiogram An echocardiogram is a procedure that uses painless sound waves (ultrasound) to produce an image of the heart. Images from an echocardiogram can provide  important information about:  Signs of coronary artery disease (CAD).  Aneurysm detection. An aneurysm is a weak or damaged part of an artery wall that bulges out from the normal force of blood pumping through the body.  Heart size and shape. Changes in the size or shape of the heart can be associated with certain conditions, including heart failure, aneurysm, and CAD.  Heart muscle function.  Heart valve function.  Signs of a past heart attack.  Fluid buildup around the heart.  Thickening of the heart muscle.  A tumor or infectious growth around the heart valves. Tell a health care provider about:  Any allergies you have.  All medicines you are taking, including vitamins, herbs, eye drops, creams, and over-the-counter medicines.  Any blood disorders you have.  Any surgeries you have had.  Any medical conditions you have.  Whether you are pregnant or may be pregnant. What are the risks? Generally, this is a safe procedure. However, problems may occur, including:  Allergic reaction to dye (contrast) that may be used during the procedure. What happens before the procedure? No specific preparation is needed. You may eat and drink normally. What happens during the procedure?   An IV tube may be inserted into one of your veins.  You may receive contrast through this tube. A contrast is an injection that improves the quality of the pictures from your heart.  A gel will be applied to your chest.  A wand-like tool (transducer) will be moved over your chest. The gel will help to transmit the sound waves from the transducer.  The sound waves will harmlessly bounce off of your heart to allow the heart images to be captured in real-time motion. The images will be recorded on a computer. The procedure may vary among health care providers and hospitals. What happens after the procedure?  You may return to your normal, everyday life, including diet, activities, and medicines,  unless your health care provider tells you not to do that. Summary  An echocardiogram is a procedure that uses painless sound waves (ultrasound) to produce an image of the heart.  Images from an echocardiogram can provide important information about the size and shape of your heart, heart muscle function, heart valve function, and fluid buildup around your heart.  You do not need to do anything to prepare before this procedure. You may eat and drink normally.  After the echocardiogram is completed, you may return to your normal, everyday life, unless your health care provider tells you not to do that. This information is not intended to replace advice given to you by your health care provider. Make sure you discuss any questions you have with your health care provider. Document Released: 11/10/2000 Document Revised: 12/16/2016 Document Reviewed: 12/16/2016 Elsevier Interactive Patient Education  2019 [employer]

## 2019-01-08 NOTE — Addendum Note (Signed)
Addended by: Lita Mains on: 01/08/2019 10:48 AM   Modules accepted: Orders

## 2019-01-27 ENCOUNTER — Other Ambulatory Visit: Payer: Self-pay

## 2019-01-27 MED ORDER — BENAZEPRIL HCL 40 MG PO TABS: 40.0000 mg | ORAL_TABLET | Freq: Every day | ORAL | 6 refills | Status: DC

## 2019-01-27 MED ORDER — BENAZEPRIL HCL 20 MG PO TABS: 20.0000 mg | ORAL_TABLET | Freq: Every day | ORAL | 6 refills | Status: DC

## 2019-02-12 ENCOUNTER — Other Ambulatory Visit: Payer: Medicare Other

## 2019-03-26 ENCOUNTER — Telehealth: Payer: Self-pay | Admitting: Cardiology

## 2019-03-26 MED ORDER — DILTIAZEM HCL ER COATED BEADS 240 MG PO CP24: 240.0000 mg | ORAL_CAPSULE | Freq: Every day | ORAL | 2 refills | Status: DC

## 2019-03-26 NOTE — Telephone Encounter (Signed)
°*  STAT* If patient is at the pharmacy, call can be transferred to refill team.   1. Which medications need to be refilled? (please list name of each medication and dose if known) diltiazem (CARDIZEM CD) 240 MG 24 hr capsule   2. Which pharmacy/location (including street and city if local pharmacy) is medication to be sent to?  [facility] [address] [facility] - [place], [place] - [address] [phone] (Phone) [phone] (Fax)    3. Do they need a 30 day or 90 day supply? 90 day

## 2019-03-26 NOTE — Telephone Encounter (Signed)
Cardizem refill sent to [facility] in [place] per pt. preference

## 2019-03-31 ENCOUNTER — Other Ambulatory Visit: Payer: Self-pay | Admitting: *Deleted

## 2019-03-31 ENCOUNTER — Telehealth: Payer: Self-pay | Admitting: *Deleted

## 2019-03-31 MED ORDER — HYDROCHLOROTHIAZIDE 12.5 MG PO CAPS: 12.5000 mg | ORAL_CAPSULE | Freq: Every day | ORAL | 1 refills | Status: DC

## 2019-03-31 MED ORDER — METOPROLOL SUCCINATE ER 25 MG PO TB24: 25.0000 mg | ORAL_TABLET | Freq: Every day | ORAL | 1 refills | Status: DC

## 2019-03-31 NOTE — Telephone Encounter (Signed)
Refills sent to [facility] in [place] as requested.

## 2019-03-31 NOTE — Telephone Encounter (Signed)
Pt's daughter called to request refills and to cancel pt's appt. Daughter says pt has dementia and she feels a video or telephone visit would be confusing for her. Daughter says pt is doing well, she see's her every day and takes care of her meds. Wanted to make sure canceling appt is okay with Dr. Bing Matter.

## 2019-03-31 NOTE — Telephone Encounter (Signed)
Ok with me 

## 2019-03-31 NOTE — Telephone Encounter (Signed)
*  STAT* If patient is at the pharmacy, call can be transferred to refill team.   1. Which medications need to be refilled? (please list name of each medication and dose if known) Metoprolol Succinate 25 mg qd  HcTz 12.5 mg, qd  2. Which pharmacy/location (including street and city if local pharmacy) is medication to be sent to?[facility]  3. Do they need a 30 day or 90 day supply? 90

## 2019-04-08 ENCOUNTER — Telehealth: Payer: Medicare Other | Admitting: Cardiology

## 2019-05-14 ENCOUNTER — Other Ambulatory Visit: Payer: Medicare Other

## 2019-06-23 ENCOUNTER — Other Ambulatory Visit: Payer: Self-pay | Admitting: Cardiology

## 2019-07-21 ENCOUNTER — Other Ambulatory Visit: Payer: Self-pay | Admitting: Cardiology

## 2019-09-06 ENCOUNTER — Other Ambulatory Visit: Payer: Self-pay | Admitting: Cardiology

## 2019-11-12 ENCOUNTER — Encounter: Payer: Self-pay | Admitting: Cardiology

## 2019-11-12 ENCOUNTER — Other Ambulatory Visit: Payer: Self-pay

## 2019-11-12 ENCOUNTER — Ambulatory Visit (INDEPENDENT_AMBULATORY_CARE_PROVIDER_SITE_OTHER): Payer: Medicare Other | Admitting: Cardiology

## 2019-11-12 VITALS — BP 110/54 | HR 62 | Ht 64.5 in | Wt 146.8 lb

## 2019-11-12 DIAGNOSIS — I493 Ventricular premature depolarization: Secondary | ICD-10-CM

## 2019-11-12 DIAGNOSIS — I447 Left bundle-branch block, unspecified: Secondary | ICD-10-CM | POA: Diagnosis not present

## 2019-11-12 DIAGNOSIS — R457 State of emotional shock and stress, unspecified: Secondary | ICD-10-CM

## 2019-11-12 DIAGNOSIS — I351 Nonrheumatic aortic (valve) insufficiency: Secondary | ICD-10-CM | POA: Diagnosis not present

## 2019-11-12 NOTE — Patient Instructions (Signed)
Medication Instructions:  Your physician has recommended you make the following change in your medication:   STOP: Metoprolol   *If you need a refill on your cardiac medications before your next appointment, please call your pharmacy*  Lab Work: None . If you have labs (blood work) drawn today and your tests are completely normal, you will receive your results only by: Marland Kitchen MyChart Message (if you have MyChart) OR . A paper copy in the mail If you have any lab test that is abnormal or we need to change your treatment, we will call you to review the results.  Testing/Procedures: None.   Follow-Up: At [facility], you and your health needs are our priority.  As part of our continuing mission to provide you with exceptional heart care, we have created designated Provider Care Teams.  These Care Teams include your primary Cardiologist (physician) and Advanced Practice Providers (APPs -  Physician Assistants and Nurse Practitioners) who all work together to provide you with the care you need, when you need it.  Your next appointment:   6 month(s)  The format for your next appointment:   In Person  Provider:   Jenne Campus, MD  Other Instructions

## 2019-11-12 NOTE — Addendum Note (Signed)
Addended by: Linna Hoff R on: 11/12/2019 11:05 AM   Modules accepted: Orders

## 2019-11-12 NOTE — Progress Notes (Signed)
Cardiology Office Note:    Date:  11/12/2019   ID:  Sabrina [name], DOB [birth year], MRN [id number]  PCP:  Olive Bass, MD  Cardiologist:  Gypsy Balsam, MD    Referring MD: Olive Bass, MD   Chief Complaint  Patient presents with  . Follow-up    History of Present Illness:    Sabrina [name] is a 85 y.o. female with aortic insufficiency, also carotic arterial disease, early dementia, left bundle branch block, PVCs.  She comes today to my office for follow-up.  Tragedy strike she lost her husband she has been married more than 60 years.  He did have malignant melanoma with metastasis to multiple organs of his body it was diagnosed in the summer he elected to only palliative care.  He passed recently.  She still grieving after that.  She lost significant amount of weight lost appetite and overall seems to be somewhat depressed.  She comes to my office with her daughter who is her caregiver.  Really taking very good care of her.  There was a plan to move her to assisted living.  She is fine with that.  Denies having any chest pain tightness squeezing pressure burning chest no palpitations.  We will talk about potential doing echocardiogram we will were scheduling it in February however it did not happen because of coronavirus.  She does not want to have it right now.  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  Therefore, she does not want to do it.  She wanted me to stop a lot of medications we went well this medication all appropriate I will continue however the only opportunity of stopping something I see in metoprolol 25 daily.  I asked him to hold his medication and see how she does.  Past Medical History:  Diagnosis Date  . Cognitive disorder    early dementia?  . Diverticular disease    L colon  . History of Doppler ultrasound 12/30/2007   carotid doppler; L Mid ICA 0-49% diameter reduction, R & L  subclavian artery 0-49% diameter reduction   . History of nuclear stress test 11/23/2006   dipyridamole; normal pattern of perfusion; low risk scan   . Hyperlipidemia   . Hypertension     Past Surgical History:  Procedure Laterality Date  . CARDIAC CATHETERIZATION  2001   non occlusive disease, 40% stenosis of LAD; LVH  . CATARACT EXTRACTION  04/2013, 05/2013   x2  . eyelid surgery  02/26/2003   r/t dematochalsis of upper eyelids  . POLYPECTOMY  06/2003  . SKIN BIOPSY  02/22/2017  . TRANSTHORACIC ECHOCARDIOGRAM  08/09/2011   EF>55%; mild concentric LVH; LA mildly dilated; mild MV prolapse; trace MR, mild TR, trace AV regurg, trace PV regurg    Current Medications: Current Meds  Medication Sig  . aspirin 81 MG EC tablet Take 81 mg by mouth daily. Swallow whole.  Marland Kitchen atorvastatin (LIPITOR) 20 MG tablet Take 20 mg by mouth daily.  . benazepril (LOTENSIN) 20 MG tablet TAKE 1 TABLET BY MOUTH ONCE (1) DAILY  . calcium carbonate (OS-CAL) 600 MG TABS tablet Take 600 mg by mouth daily with breakfast.   . Cholecalciferol (VITAMIN D PO) Take 5,000 Units by mouth daily.   Marland Kitchen diltiazem (CARDIZEM CD) 240 MG 24 hr capsule TAKE 1 CAPSULE BY MOUTH ONCE (1) DAILY  . donepezil (ARICEPT) 10 MG  tablet Take 1 tablet (10 mg total) by mouth at bedtime.  . hydrochlorothiazide (MICROZIDE) 12.5 MG capsule TAKE 1 CAPSULE BY MOUTH ONCE (1) DAILY  . memantine (NAMENDA) 10 MG tablet TAKE ONE TABLET BY MOUTH TWICE DAILY (Patient taking differently: TAKE HALF TABLET IN THE AM AND ONE TABLET IN THE PM)  . metoprolol succinate (TOPROL-XL) 25 MG 24 hr tablet TAKE 1 TABLET BY MOUTH ONCE (1) DAILY     Allergies:   Codeine, Penicillins, and Tramadol   Social History   Socioeconomic History  . Marital status: Single    Spouse name: Not on file  . Number of children: 2  . Years of education: Not on file  . Highest education level: Not on file  Occupational History  . Not on file  Tobacco Use  . Smoking status: Never  Smoker  . Smokeless tobacco: Never Used  Substance and Sexual Activity  . Alcohol use: No  . Drug use: No  . Sexual activity: Not Currently  Other Topics Concern  . Not on file  Social History Narrative  . Not on file   Social Determinants of Health   Financial Resource Strain:   . Difficulty of Paying Living Expenses: Not on file  Food Insecurity:   . Worried About Charity fundraiser in the Last Year: Not on file  . Ran Out of Food in the Last Year: Not on file  Transportation Needs:   . Lack of Transportation (Medical): Not on file  . Lack of Transportation (Non-Medical): Not on file  Physical Activity:   . Days of Exercise per Week: Not on file  . Minutes of Exercise per Session: Not on file  Stress:   . Feeling of Stress : Not on file  Social Connections:   . Frequency of Communication with Friends and Family: Not on file  . Frequency of Social Gatherings with Friends and Family: Not on file  . Attends Religious Services: Not on file  . Active Member of Clubs or Organizations: Not on file  . Attends Archivist Meetings: Not on file  . Marital Status: Not on file     Family History: The patient's family history includes Cancer in her sister; Diabetes in her brother; Heart Problems in her brother and sister; Heart disease in her father; Hypertension in her mother and sister; Stroke in her mother. ROS:   Please see the history of present illness.    All 14 point review of systems negative except as described per history of present illness  EKGs/Labs/Other Studies Reviewed:      Recent Labs: No results found for requested labs within last 8760 hours.  Recent Lipid Panel    Component Value Date/Time   CHOL 127 01/26/2017 0812   TRIG 174 (H) 01/26/2017 0812   HDL 44 (L) 01/26/2017 0812   CHOLHDL 2.9 01/26/2017 0812   VLDL 35 (H) 01/26/2017 0812   LDLCALC 48 01/26/2017 0812    Physical Exam:    VS:  BP (!) 110/54   Pulse 62   Ht 5' 4.5" (1.638 m)    Wt 146 lb 12.8 oz (66.6 kg)   SpO2 96%   BMI 24.81 kg/m     Wt Readings from Last 3 Encounters:  11/12/19 146 lb 12.8 oz (66.6 kg)  01/08/19 164 lb 9.6 oz (74.7 kg)  01/08/18 161 lb (73 kg)     GEN:  Well nourished, well developed in no acute distress HEENT: Normal NECK: No JVD;  No carotid bruits LYMPHATICS: No lymphadenopathy CARDIAC: RRR, soft diastolic blowing murmur grade 1/6 best heard in the apex, no rubs, no gallops RESPIRATORY:  Clear to auscultation without rales, wheezing or rhonchi  ABDOMEN: Soft, non-tender, non-distended MUSCULOSKELETAL:  No edema; No deformity  SKIN: Warm and dry LOWER EXTREMITIES: no swelling NEUROLOGIC:  Alert and oriented x 3 PSYCHIATRIC:  Normal affect   ASSESSMENT:    1. LBBB (left bundle branch block)   2. Nonrheumatic aortic valve insufficiency   3. Emotional stress   4. PVC (premature ventricular contraction)    PLAN:    In order of problems listed above:  1. Left bundle branch block stable on appropriate medication continue.  Asymptomatic. 2. Nonrheumatic aortic valve insufficiency we talked about doing echocardiogram she does not want to do it right now. 3. Emotional stress after loss of her husband.  She is coping with the situation appropriately. 4. PVCs denies having any palpitations.   Medication Adjustments/Labs and Tests Ordered: Current medicines are reviewed at length with the patient today.  Concerns regarding medicines are outlined above.  No orders of the defined types were placed in this encounter.  Medication changes: No orders of the defined types were placed in this encounter.   Signed, Georgeanna Leaobert J. Madilynne Mullan, MD, [facility] 11/12/2019 10:59 AM    [facility]

## 2020-03-09 DIAGNOSIS — B351 Tinea unguium: Secondary | ICD-10-CM | POA: Diagnosis not present

## 2020-03-09 DIAGNOSIS — L609 Nail disorder, unspecified: Secondary | ICD-10-CM | POA: Diagnosis not present

## 2020-03-12 DIAGNOSIS — E1151 Type 2 diabetes mellitus with diabetic peripheral angiopathy without gangrene: Secondary | ICD-10-CM | POA: Diagnosis not present

## 2020-03-12 DIAGNOSIS — I2510 Atherosclerotic heart disease of native coronary artery without angina pectoris: Secondary | ICD-10-CM | POA: Diagnosis not present

## 2020-03-12 DIAGNOSIS — F0390 Unspecified dementia without behavioral disturbance: Secondary | ICD-10-CM | POA: Diagnosis not present

## 2020-03-12 DIAGNOSIS — I10 Essential (primary) hypertension: Secondary | ICD-10-CM | POA: Diagnosis not present

## 2020-05-05 DIAGNOSIS — I2510 Atherosclerotic heart disease of native coronary artery without angina pectoris: Secondary | ICD-10-CM | POA: Diagnosis not present

## 2020-05-05 DIAGNOSIS — E119 Type 2 diabetes mellitus without complications: Secondary | ICD-10-CM | POA: Diagnosis not present

## 2020-05-05 DIAGNOSIS — I10 Essential (primary) hypertension: Secondary | ICD-10-CM | POA: Diagnosis not present

## 2020-05-05 DIAGNOSIS — I4891 Unspecified atrial fibrillation: Secondary | ICD-10-CM | POA: Diagnosis not present

## 2020-05-05 DIAGNOSIS — F0390 Unspecified dementia without behavioral disturbance: Secondary | ICD-10-CM | POA: Diagnosis not present

## 2020-05-05 DIAGNOSIS — I739 Peripheral vascular disease, unspecified: Secondary | ICD-10-CM | POA: Diagnosis not present

## 2020-05-06 DIAGNOSIS — R54 Age-related physical debility: Secondary | ICD-10-CM | POA: Diagnosis not present

## 2020-05-06 DIAGNOSIS — Z20828 Contact with and (suspected) exposure to other viral communicable diseases: Secondary | ICD-10-CM | POA: Diagnosis not present

## 2020-05-06 DIAGNOSIS — Z20822 Contact with and (suspected) exposure to covid-19: Secondary | ICD-10-CM | POA: Diagnosis not present

## 2020-05-07 DIAGNOSIS — I10 Essential (primary) hypertension: Secondary | ICD-10-CM | POA: Diagnosis not present

## 2020-05-07 DIAGNOSIS — E119 Type 2 diabetes mellitus without complications: Secondary | ICD-10-CM | POA: Diagnosis not present

## 2020-05-12 DIAGNOSIS — R54 Age-related physical debility: Secondary | ICD-10-CM | POA: Diagnosis not present

## 2020-05-12 DIAGNOSIS — Z20822 Contact with and (suspected) exposure to covid-19: Secondary | ICD-10-CM | POA: Diagnosis not present

## 2020-05-12 DIAGNOSIS — Z20828 Contact with and (suspected) exposure to other viral communicable diseases: Secondary | ICD-10-CM | POA: Diagnosis not present

## 2020-05-13 DIAGNOSIS — Z20828 Contact with and (suspected) exposure to other viral communicable diseases: Secondary | ICD-10-CM | POA: Diagnosis not present

## 2020-05-27 DIAGNOSIS — L821 Other seborrheic keratosis: Secondary | ICD-10-CM | POA: Diagnosis not present

## 2020-05-27 DIAGNOSIS — L820 Inflamed seborrheic keratosis: Secondary | ICD-10-CM | POA: Diagnosis not present

## 2020-05-27 DIAGNOSIS — L578 Other skin changes due to chronic exposure to nonionizing radiation: Secondary | ICD-10-CM | POA: Diagnosis not present

## 2020-06-08 DIAGNOSIS — B351 Tinea unguium: Secondary | ICD-10-CM | POA: Diagnosis not present

## 2020-06-08 DIAGNOSIS — L609 Nail disorder, unspecified: Secondary | ICD-10-CM | POA: Diagnosis not present

## 2020-06-24 DIAGNOSIS — Z20828 Contact with and (suspected) exposure to other viral communicable diseases: Secondary | ICD-10-CM | POA: Diagnosis not present

## 2020-07-01 DIAGNOSIS — Z20828 Contact with and (suspected) exposure to other viral communicable diseases: Secondary | ICD-10-CM | POA: Diagnosis not present

## 2020-07-08 DIAGNOSIS — Z20828 Contact with and (suspected) exposure to other viral communicable diseases: Secondary | ICD-10-CM | POA: Diagnosis not present

## 2020-07-14 DIAGNOSIS — Z20828 Contact with and (suspected) exposure to other viral communicable diseases: Secondary | ICD-10-CM | POA: Diagnosis not present

## 2020-07-21 DIAGNOSIS — Z20828 Contact with and (suspected) exposure to other viral communicable diseases: Secondary | ICD-10-CM | POA: Diagnosis not present

## 2020-07-28 DIAGNOSIS — Z20828 Contact with and (suspected) exposure to other viral communicable diseases: Secondary | ICD-10-CM | POA: Diagnosis not present

## 2020-08-04 DIAGNOSIS — Z20828 Contact with and (suspected) exposure to other viral communicable diseases: Secondary | ICD-10-CM | POA: Diagnosis not present

## 2020-08-11 DIAGNOSIS — Z23 Encounter for immunization: Secondary | ICD-10-CM | POA: Diagnosis not present

## 2020-08-11 DIAGNOSIS — Z20828 Contact with and (suspected) exposure to other viral communicable diseases: Secondary | ICD-10-CM | POA: Diagnosis not present

## 2020-08-18 DIAGNOSIS — Z20828 Contact with and (suspected) exposure to other viral communicable diseases: Secondary | ICD-10-CM | POA: Diagnosis not present

## 2020-08-25 DIAGNOSIS — Z20828 Contact with and (suspected) exposure to other viral communicable diseases: Secondary | ICD-10-CM | POA: Diagnosis not present

## 2020-09-07 DIAGNOSIS — B351 Tinea unguium: Secondary | ICD-10-CM | POA: Diagnosis not present

## 2020-09-07 DIAGNOSIS — L609 Nail disorder, unspecified: Secondary | ICD-10-CM | POA: Diagnosis not present

## 2020-09-17 DIAGNOSIS — I2510 Atherosclerotic heart disease of native coronary artery without angina pectoris: Secondary | ICD-10-CM | POA: Diagnosis not present

## 2020-09-17 DIAGNOSIS — E119 Type 2 diabetes mellitus without complications: Secondary | ICD-10-CM | POA: Diagnosis not present

## 2020-09-17 DIAGNOSIS — I739 Peripheral vascular disease, unspecified: Secondary | ICD-10-CM | POA: Diagnosis not present

## 2020-09-17 DIAGNOSIS — F0390 Unspecified dementia without behavioral disturbance: Secondary | ICD-10-CM | POA: Diagnosis not present

## 2020-09-17 DIAGNOSIS — I4891 Unspecified atrial fibrillation: Secondary | ICD-10-CM | POA: Diagnosis not present

## 2020-09-17 DIAGNOSIS — I10 Essential (primary) hypertension: Secondary | ICD-10-CM | POA: Diagnosis not present

## 2020-09-20 DIAGNOSIS — I10 Essential (primary) hypertension: Secondary | ICD-10-CM | POA: Diagnosis not present

## 2020-09-20 DIAGNOSIS — D519 Vitamin B12 deficiency anemia, unspecified: Secondary | ICD-10-CM | POA: Diagnosis not present

## 2020-09-20 DIAGNOSIS — E119 Type 2 diabetes mellitus without complications: Secondary | ICD-10-CM | POA: Diagnosis not present

## 2020-11-02 DIAGNOSIS — Z20828 Contact with and (suspected) exposure to other viral communicable diseases: Secondary | ICD-10-CM | POA: Diagnosis not present

## 2020-11-03 DIAGNOSIS — I4891 Unspecified atrial fibrillation: Secondary | ICD-10-CM | POA: Diagnosis not present

## 2020-11-03 DIAGNOSIS — I10 Essential (primary) hypertension: Secondary | ICD-10-CM | POA: Diagnosis not present

## 2020-11-03 DIAGNOSIS — I2510 Atherosclerotic heart disease of native coronary artery without angina pectoris: Secondary | ICD-10-CM | POA: Diagnosis not present

## 2020-11-03 DIAGNOSIS — F0390 Unspecified dementia without behavioral disturbance: Secondary | ICD-10-CM | POA: Diagnosis not present

## 2020-11-10 DIAGNOSIS — Z20828 Contact with and (suspected) exposure to other viral communicable diseases: Secondary | ICD-10-CM | POA: Diagnosis not present

## 2020-12-01 DIAGNOSIS — N390 Urinary tract infection, site not specified: Secondary | ICD-10-CM | POA: Diagnosis not present

## 2020-12-06 DIAGNOSIS — Z20828 Contact with and (suspected) exposure to other viral communicable diseases: Secondary | ICD-10-CM | POA: Diagnosis not present

## 2020-12-07 DIAGNOSIS — B351 Tinea unguium: Secondary | ICD-10-CM | POA: Diagnosis not present

## 2020-12-07 DIAGNOSIS — L609 Nail disorder, unspecified: Secondary | ICD-10-CM | POA: Diagnosis not present

## 2020-12-14 DIAGNOSIS — Z20828 Contact with and (suspected) exposure to other viral communicable diseases: Secondary | ICD-10-CM | POA: Diagnosis not present

## 2020-12-15 DIAGNOSIS — Z87440 Personal history of urinary (tract) infections: Secondary | ICD-10-CM | POA: Diagnosis not present

## 2020-12-15 DIAGNOSIS — R4182 Altered mental status, unspecified: Secondary | ICD-10-CM | POA: Diagnosis not present

## 2020-12-15 DIAGNOSIS — R1030 Lower abdominal pain, unspecified: Secondary | ICD-10-CM | POA: Diagnosis not present

## 2020-12-15 DIAGNOSIS — F0390 Unspecified dementia without behavioral disturbance: Secondary | ICD-10-CM | POA: Diagnosis not present

## 2020-12-16 DIAGNOSIS — F0391 Unspecified dementia with behavioral disturbance: Secondary | ICD-10-CM | POA: Diagnosis not present

## 2020-12-16 DIAGNOSIS — F600 Paranoid personality disorder: Secondary | ICD-10-CM | POA: Diagnosis not present

## 2020-12-16 DIAGNOSIS — N390 Urinary tract infection, site not specified: Secondary | ICD-10-CM | POA: Diagnosis not present

## 2020-12-16 DIAGNOSIS — R451 Restlessness and agitation: Secondary | ICD-10-CM | POA: Diagnosis not present

## 2020-12-16 DIAGNOSIS — R4182 Altered mental status, unspecified: Secondary | ICD-10-CM | POA: Diagnosis not present

## 2020-12-21 DIAGNOSIS — Z20828 Contact with and (suspected) exposure to other viral communicable diseases: Secondary | ICD-10-CM | POA: Diagnosis not present

## 2020-12-28 DIAGNOSIS — F419 Anxiety disorder, unspecified: Secondary | ICD-10-CM | POA: Diagnosis not present

## 2020-12-28 DIAGNOSIS — Z20828 Contact with and (suspected) exposure to other viral communicable diseases: Secondary | ICD-10-CM | POA: Diagnosis not present

## 2020-12-29 DIAGNOSIS — E119 Type 2 diabetes mellitus without complications: Secondary | ICD-10-CM | POA: Diagnosis not present

## 2020-12-29 DIAGNOSIS — F419 Anxiety disorder, unspecified: Secondary | ICD-10-CM | POA: Diagnosis not present

## 2020-12-29 DIAGNOSIS — R443 Hallucinations, unspecified: Secondary | ICD-10-CM | POA: Diagnosis not present

## 2020-12-29 DIAGNOSIS — R269 Unspecified abnormalities of gait and mobility: Secondary | ICD-10-CM | POA: Diagnosis not present

## 2020-12-29 DIAGNOSIS — I4891 Unspecified atrial fibrillation: Secondary | ICD-10-CM | POA: Diagnosis not present

## 2020-12-29 DIAGNOSIS — F0390 Unspecified dementia without behavioral disturbance: Secondary | ICD-10-CM | POA: Diagnosis not present

## 2020-12-29 DIAGNOSIS — M6281 Muscle weakness (generalized): Secondary | ICD-10-CM | POA: Diagnosis not present

## 2021-01-05 DIAGNOSIS — Z20828 Contact with and (suspected) exposure to other viral communicable diseases: Secondary | ICD-10-CM | POA: Diagnosis not present

## 2021-01-11 DIAGNOSIS — F0390 Unspecified dementia without behavioral disturbance: Secondary | ICD-10-CM | POA: Diagnosis not present

## 2021-01-11 DIAGNOSIS — F419 Anxiety disorder, unspecified: Secondary | ICD-10-CM | POA: Diagnosis not present

## 2021-01-11 DIAGNOSIS — R443 Hallucinations, unspecified: Secondary | ICD-10-CM | POA: Diagnosis not present

## 2021-01-12 DIAGNOSIS — Z20828 Contact with and (suspected) exposure to other viral communicable diseases: Secondary | ICD-10-CM | POA: Diagnosis not present

## 2021-01-17 DIAGNOSIS — F419 Anxiety disorder, unspecified: Secondary | ICD-10-CM | POA: Diagnosis not present

## 2021-01-20 DIAGNOSIS — F419 Anxiety disorder, unspecified: Secondary | ICD-10-CM | POA: Diagnosis not present

## 2021-01-20 DIAGNOSIS — Z5181 Encounter for therapeutic drug level monitoring: Secondary | ICD-10-CM | POA: Diagnosis not present

## 2021-01-20 DIAGNOSIS — R54 Age-related physical debility: Secondary | ICD-10-CM | POA: Diagnosis not present

## 2021-01-26 DIAGNOSIS — F0390 Unspecified dementia without behavioral disturbance: Secondary | ICD-10-CM | POA: Diagnosis not present

## 2021-01-26 DIAGNOSIS — R443 Hallucinations, unspecified: Secondary | ICD-10-CM | POA: Diagnosis not present

## 2021-01-26 DIAGNOSIS — F419 Anxiety disorder, unspecified: Secondary | ICD-10-CM | POA: Diagnosis not present

## 2021-02-07 DIAGNOSIS — F419 Anxiety disorder, unspecified: Secondary | ICD-10-CM | POA: Diagnosis not present

## 2021-02-16 DIAGNOSIS — M6281 Muscle weakness (generalized): Secondary | ICD-10-CM | POA: Diagnosis not present

## 2021-02-16 DIAGNOSIS — R600 Localized edema: Secondary | ICD-10-CM | POA: Diagnosis not present

## 2021-02-16 DIAGNOSIS — R269 Unspecified abnormalities of gait and mobility: Secondary | ICD-10-CM | POA: Diagnosis not present

## 2021-02-16 DIAGNOSIS — L03115 Cellulitis of right lower limb: Secondary | ICD-10-CM | POA: Diagnosis not present

## 2021-02-16 DIAGNOSIS — F0390 Unspecified dementia without behavioral disturbance: Secondary | ICD-10-CM | POA: Diagnosis not present

## 2021-02-24 DIAGNOSIS — R600 Localized edema: Secondary | ICD-10-CM | POA: Diagnosis not present

## 2021-02-24 DIAGNOSIS — M6281 Muscle weakness (generalized): Secondary | ICD-10-CM | POA: Diagnosis not present

## 2021-02-24 DIAGNOSIS — I739 Peripheral vascular disease, unspecified: Secondary | ICD-10-CM | POA: Diagnosis not present

## 2021-02-24 DIAGNOSIS — R269 Unspecified abnormalities of gait and mobility: Secondary | ICD-10-CM | POA: Diagnosis not present

## 2021-02-24 DIAGNOSIS — F0390 Unspecified dementia without behavioral disturbance: Secondary | ICD-10-CM | POA: Diagnosis not present

## 2021-02-26 DIAGNOSIS — R2242 Localized swelling, mass and lump, left lower limb: Secondary | ICD-10-CM | POA: Diagnosis not present

## 2021-02-26 DIAGNOSIS — L539 Erythematous condition, unspecified: Secondary | ICD-10-CM | POA: Diagnosis not present

## 2021-02-28 DIAGNOSIS — I739 Peripheral vascular disease, unspecified: Secondary | ICD-10-CM | POA: Diagnosis not present

## 2021-02-28 DIAGNOSIS — R600 Localized edema: Secondary | ICD-10-CM | POA: Diagnosis not present

## 2021-02-28 DIAGNOSIS — F0390 Unspecified dementia without behavioral disturbance: Secondary | ICD-10-CM | POA: Diagnosis not present

## 2021-02-28 DIAGNOSIS — R269 Unspecified abnormalities of gait and mobility: Secondary | ICD-10-CM | POA: Diagnosis not present

## 2021-02-28 DIAGNOSIS — I4891 Unspecified atrial fibrillation: Secondary | ICD-10-CM | POA: Diagnosis not present

## 2021-03-01 DIAGNOSIS — E559 Vitamin D deficiency, unspecified: Secondary | ICD-10-CM | POA: Diagnosis not present

## 2021-03-01 DIAGNOSIS — E785 Hyperlipidemia, unspecified: Secondary | ICD-10-CM | POA: Diagnosis not present

## 2021-03-01 DIAGNOSIS — I509 Heart failure, unspecified: Secondary | ICD-10-CM | POA: Diagnosis not present

## 2021-03-01 DIAGNOSIS — E119 Type 2 diabetes mellitus without complications: Secondary | ICD-10-CM | POA: Diagnosis not present

## 2021-03-01 DIAGNOSIS — I10 Essential (primary) hypertension: Secondary | ICD-10-CM | POA: Diagnosis not present

## 2021-03-01 DIAGNOSIS — D649 Anemia, unspecified: Secondary | ICD-10-CM | POA: Diagnosis not present

## 2021-03-01 DIAGNOSIS — E039 Hypothyroidism, unspecified: Secondary | ICD-10-CM | POA: Diagnosis not present

## 2021-03-02 DIAGNOSIS — F0390 Unspecified dementia without behavioral disturbance: Secondary | ICD-10-CM | POA: Diagnosis not present

## 2021-03-02 DIAGNOSIS — E119 Type 2 diabetes mellitus without complications: Secondary | ICD-10-CM | POA: Diagnosis not present

## 2021-03-02 DIAGNOSIS — I739 Peripheral vascular disease, unspecified: Secondary | ICD-10-CM | POA: Diagnosis not present

## 2021-03-02 DIAGNOSIS — R600 Localized edema: Secondary | ICD-10-CM | POA: Diagnosis not present

## 2021-03-02 DIAGNOSIS — F419 Anxiety disorder, unspecified: Secondary | ICD-10-CM | POA: Diagnosis not present

## 2021-03-02 DIAGNOSIS — R799 Abnormal finding of blood chemistry, unspecified: Secondary | ICD-10-CM | POA: Diagnosis not present

## 2021-03-02 DIAGNOSIS — R443 Hallucinations, unspecified: Secondary | ICD-10-CM | POA: Diagnosis not present

## 2021-03-08 DIAGNOSIS — B351 Tinea unguium: Secondary | ICD-10-CM | POA: Diagnosis not present

## 2021-03-08 DIAGNOSIS — L609 Nail disorder, unspecified: Secondary | ICD-10-CM | POA: Diagnosis not present

## 2021-03-08 DIAGNOSIS — F419 Anxiety disorder, unspecified: Secondary | ICD-10-CM | POA: Diagnosis not present

## 2021-03-09 DIAGNOSIS — I10 Essential (primary) hypertension: Secondary | ICD-10-CM | POA: Diagnosis not present

## 2021-03-09 DIAGNOSIS — I4891 Unspecified atrial fibrillation: Secondary | ICD-10-CM | POA: Diagnosis not present

## 2021-03-09 DIAGNOSIS — I739 Peripheral vascular disease, unspecified: Secondary | ICD-10-CM | POA: Diagnosis not present

## 2021-03-09 DIAGNOSIS — F0390 Unspecified dementia without behavioral disturbance: Secondary | ICD-10-CM | POA: Diagnosis not present

## 2021-03-09 DIAGNOSIS — M6281 Muscle weakness (generalized): Secondary | ICD-10-CM | POA: Diagnosis not present

## 2021-03-09 DIAGNOSIS — I2510 Atherosclerotic heart disease of native coronary artery without angina pectoris: Secondary | ICD-10-CM | POA: Diagnosis not present

## 2021-03-09 DIAGNOSIS — E119 Type 2 diabetes mellitus without complications: Secondary | ICD-10-CM | POA: Diagnosis not present

## 2021-03-09 DIAGNOSIS — R269 Unspecified abnormalities of gait and mobility: Secondary | ICD-10-CM | POA: Diagnosis not present

## 2021-03-22 DIAGNOSIS — Z20828 Contact with and (suspected) exposure to other viral communicable diseases: Secondary | ICD-10-CM | POA: Diagnosis not present

## 2021-03-28 DIAGNOSIS — F419 Anxiety disorder, unspecified: Secondary | ICD-10-CM | POA: Diagnosis not present

## 2021-03-28 DIAGNOSIS — Z20828 Contact with and (suspected) exposure to other viral communicable diseases: Secondary | ICD-10-CM | POA: Diagnosis not present

## 2021-04-04 DIAGNOSIS — Z20828 Contact with and (suspected) exposure to other viral communicable diseases: Secondary | ICD-10-CM | POA: Diagnosis not present

## 2021-04-11 DIAGNOSIS — Z20828 Contact with and (suspected) exposure to other viral communicable diseases: Secondary | ICD-10-CM | POA: Diagnosis not present

## 2021-04-18 DIAGNOSIS — Z20828 Contact with and (suspected) exposure to other viral communicable diseases: Secondary | ICD-10-CM | POA: Diagnosis not present

## 2021-04-26 DIAGNOSIS — Z20828 Contact with and (suspected) exposure to other viral communicable diseases: Secondary | ICD-10-CM | POA: Diagnosis not present

## 2021-04-27 DIAGNOSIS — F0390 Unspecified dementia without behavioral disturbance: Secondary | ICD-10-CM | POA: Diagnosis not present

## 2021-04-27 DIAGNOSIS — F419 Anxiety disorder, unspecified: Secondary | ICD-10-CM | POA: Diagnosis not present

## 2021-05-03 DIAGNOSIS — Z20828 Contact with and (suspected) exposure to other viral communicable diseases: Secondary | ICD-10-CM | POA: Diagnosis not present

## 2021-05-19 DIAGNOSIS — F419 Anxiety disorder, unspecified: Secondary | ICD-10-CM | POA: Diagnosis not present

## 2021-06-01 DIAGNOSIS — F419 Anxiety disorder, unspecified: Secondary | ICD-10-CM | POA: Diagnosis not present

## 2021-06-01 DIAGNOSIS — F0390 Unspecified dementia without behavioral disturbance: Secondary | ICD-10-CM | POA: Diagnosis not present

## 2021-06-14 DIAGNOSIS — L609 Nail disorder, unspecified: Secondary | ICD-10-CM | POA: Diagnosis not present

## 2021-06-14 DIAGNOSIS — B351 Tinea unguium: Secondary | ICD-10-CM | POA: Diagnosis not present

## 2021-06-14 DIAGNOSIS — F419 Anxiety disorder, unspecified: Secondary | ICD-10-CM | POA: Diagnosis not present

## 2021-06-27 DIAGNOSIS — Z20828 Contact with and (suspected) exposure to other viral communicable diseases: Secondary | ICD-10-CM | POA: Diagnosis not present

## 2021-06-29 DIAGNOSIS — F0390 Unspecified dementia without behavioral disturbance: Secondary | ICD-10-CM | POA: Diagnosis not present

## 2021-06-29 DIAGNOSIS — J90 Pleural effusion, not elsewhere classified: Secondary | ICD-10-CM | POA: Diagnosis not present

## 2021-06-29 DIAGNOSIS — J9600 Acute respiratory failure, unspecified whether with hypoxia or hypercapnia: Secondary | ICD-10-CM | POA: Diagnosis not present

## 2021-06-29 DIAGNOSIS — M6281 Muscle weakness (generalized): Secondary | ICD-10-CM | POA: Diagnosis not present

## 2021-06-29 DIAGNOSIS — R0689 Other abnormalities of breathing: Secondary | ICD-10-CM | POA: Diagnosis not present

## 2021-06-29 DIAGNOSIS — I10 Essential (primary) hypertension: Secondary | ICD-10-CM | POA: Diagnosis not present

## 2021-06-29 DIAGNOSIS — I340 Nonrheumatic mitral (valve) insufficiency: Secondary | ICD-10-CM | POA: Diagnosis not present

## 2021-06-29 DIAGNOSIS — I4891 Unspecified atrial fibrillation: Secondary | ICD-10-CM | POA: Diagnosis not present

## 2021-06-29 DIAGNOSIS — I509 Heart failure, unspecified: Secondary | ICD-10-CM | POA: Diagnosis not present

## 2021-06-29 DIAGNOSIS — I517 Cardiomegaly: Secondary | ICD-10-CM | POA: Diagnosis not present

## 2021-06-29 DIAGNOSIS — I361 Nonrheumatic tricuspid (valve) insufficiency: Secondary | ICD-10-CM | POA: Diagnosis not present

## 2021-06-29 DIAGNOSIS — Z792 Long term (current) use of antibiotics: Secondary | ICD-10-CM | POA: Diagnosis not present

## 2021-06-29 DIAGNOSIS — R54 Age-related physical debility: Secondary | ICD-10-CM | POA: Diagnosis not present

## 2021-06-29 DIAGNOSIS — J9601 Acute respiratory failure with hypoxia: Secondary | ICD-10-CM | POA: Diagnosis not present

## 2021-06-29 DIAGNOSIS — E119 Type 2 diabetes mellitus without complications: Secondary | ICD-10-CM | POA: Diagnosis not present

## 2021-06-29 DIAGNOSIS — I110 Hypertensive heart disease with heart failure: Secondary | ICD-10-CM | POA: Diagnosis not present

## 2021-06-29 DIAGNOSIS — J189 Pneumonia, unspecified organism: Secondary | ICD-10-CM | POA: Diagnosis not present

## 2021-06-29 DIAGNOSIS — R269 Unspecified abnormalities of gait and mobility: Secondary | ICD-10-CM | POA: Diagnosis not present

## 2021-06-29 DIAGNOSIS — R0902 Hypoxemia: Secondary | ICD-10-CM | POA: Diagnosis not present

## 2021-06-29 DIAGNOSIS — Z7982 Long term (current) use of aspirin: Secondary | ICD-10-CM | POA: Diagnosis not present

## 2021-06-29 DIAGNOSIS — I5021 Acute systolic (congestive) heart failure: Secondary | ICD-10-CM | POA: Diagnosis not present

## 2021-06-29 DIAGNOSIS — R0602 Shortness of breath: Secondary | ICD-10-CM | POA: Diagnosis not present

## 2021-07-01 DIAGNOSIS — E119 Type 2 diabetes mellitus without complications: Secondary | ICD-10-CM | POA: Diagnosis not present

## 2021-07-01 DIAGNOSIS — J189 Pneumonia, unspecified organism: Secondary | ICD-10-CM | POA: Diagnosis not present

## 2021-07-01 DIAGNOSIS — I4891 Unspecified atrial fibrillation: Secondary | ICD-10-CM | POA: Diagnosis not present

## 2021-07-01 DIAGNOSIS — J9600 Acute respiratory failure, unspecified whether with hypoxia or hypercapnia: Secondary | ICD-10-CM | POA: Diagnosis not present

## 2021-07-01 DIAGNOSIS — R269 Unspecified abnormalities of gait and mobility: Secondary | ICD-10-CM | POA: Diagnosis not present

## 2021-07-01 DIAGNOSIS — I5021 Acute systolic (congestive) heart failure: Secondary | ICD-10-CM | POA: Diagnosis not present

## 2021-07-01 DIAGNOSIS — R54 Age-related physical debility: Secondary | ICD-10-CM | POA: Diagnosis not present

## 2021-07-01 DIAGNOSIS — M6281 Muscle weakness (generalized): Secondary | ICD-10-CM | POA: Diagnosis not present

## 2021-07-01 DIAGNOSIS — I10 Essential (primary) hypertension: Secondary | ICD-10-CM | POA: Diagnosis not present

## 2021-07-02 DIAGNOSIS — J9601 Acute respiratory failure with hypoxia: Secondary | ICD-10-CM | POA: Diagnosis not present

## 2021-07-02 DIAGNOSIS — I4891 Unspecified atrial fibrillation: Secondary | ICD-10-CM | POA: Diagnosis not present

## 2021-07-02 DIAGNOSIS — F0390 Unspecified dementia without behavioral disturbance: Secondary | ICD-10-CM | POA: Diagnosis not present

## 2021-07-02 DIAGNOSIS — E119 Type 2 diabetes mellitus without complications: Secondary | ICD-10-CM | POA: Diagnosis not present

## 2021-07-02 DIAGNOSIS — I5021 Acute systolic (congestive) heart failure: Secondary | ICD-10-CM | POA: Diagnosis not present

## 2021-07-02 DIAGNOSIS — I110 Hypertensive heart disease with heart failure: Secondary | ICD-10-CM | POA: Diagnosis not present

## 2021-07-02 DIAGNOSIS — Z7984 Long term (current) use of oral hypoglycemic drugs: Secondary | ICD-10-CM | POA: Diagnosis not present

## 2021-07-04 DIAGNOSIS — J189 Pneumonia, unspecified organism: Secondary | ICD-10-CM | POA: Diagnosis not present

## 2021-07-04 DIAGNOSIS — R54 Age-related physical debility: Secondary | ICD-10-CM | POA: Diagnosis not present

## 2021-07-04 DIAGNOSIS — R600 Localized edema: Secondary | ICD-10-CM | POA: Diagnosis not present

## 2021-07-04 DIAGNOSIS — I509 Heart failure, unspecified: Secondary | ICD-10-CM | POA: Diagnosis not present

## 2021-07-04 DIAGNOSIS — Z20828 Contact with and (suspected) exposure to other viral communicable diseases: Secondary | ICD-10-CM | POA: Diagnosis not present

## 2021-07-04 DIAGNOSIS — F0390 Unspecified dementia without behavioral disturbance: Secondary | ICD-10-CM | POA: Diagnosis not present

## 2021-07-05 DIAGNOSIS — F0390 Unspecified dementia without behavioral disturbance: Secondary | ICD-10-CM | POA: Diagnosis not present

## 2021-07-05 DIAGNOSIS — I110 Hypertensive heart disease with heart failure: Secondary | ICD-10-CM | POA: Diagnosis not present

## 2021-07-05 DIAGNOSIS — J9601 Acute respiratory failure with hypoxia: Secondary | ICD-10-CM | POA: Diagnosis not present

## 2021-07-05 DIAGNOSIS — E119 Type 2 diabetes mellitus without complications: Secondary | ICD-10-CM | POA: Diagnosis not present

## 2021-07-05 DIAGNOSIS — I4891 Unspecified atrial fibrillation: Secondary | ICD-10-CM | POA: Diagnosis not present

## 2021-07-05 DIAGNOSIS — Z7984 Long term (current) use of oral hypoglycemic drugs: Secondary | ICD-10-CM | POA: Diagnosis not present

## 2021-07-05 DIAGNOSIS — I5021 Acute systolic (congestive) heart failure: Secondary | ICD-10-CM | POA: Diagnosis not present

## 2021-07-06 DIAGNOSIS — F419 Anxiety disorder, unspecified: Secondary | ICD-10-CM | POA: Diagnosis not present

## 2021-07-07 DIAGNOSIS — R54 Age-related physical debility: Secondary | ICD-10-CM | POA: Diagnosis not present

## 2021-07-07 DIAGNOSIS — R600 Localized edema: Secondary | ICD-10-CM | POA: Diagnosis not present

## 2021-07-07 DIAGNOSIS — I4891 Unspecified atrial fibrillation: Secondary | ICD-10-CM | POA: Diagnosis not present

## 2021-07-07 DIAGNOSIS — I5032 Chronic diastolic (congestive) heart failure: Secondary | ICD-10-CM | POA: Diagnosis not present

## 2021-07-07 DIAGNOSIS — I509 Heart failure, unspecified: Secondary | ICD-10-CM | POA: Diagnosis not present

## 2021-07-07 DIAGNOSIS — I10 Essential (primary) hypertension: Secondary | ICD-10-CM | POA: Diagnosis not present

## 2021-07-07 DIAGNOSIS — M6281 Muscle weakness (generalized): Secondary | ICD-10-CM | POA: Diagnosis not present

## 2021-07-07 DIAGNOSIS — I5021 Acute systolic (congestive) heart failure: Secondary | ICD-10-CM | POA: Diagnosis not present

## 2021-07-07 DIAGNOSIS — R269 Unspecified abnormalities of gait and mobility: Secondary | ICD-10-CM | POA: Diagnosis not present

## 2021-07-07 DIAGNOSIS — I110 Hypertensive heart disease with heart failure: Secondary | ICD-10-CM | POA: Diagnosis not present

## 2021-07-07 DIAGNOSIS — J9601 Acute respiratory failure with hypoxia: Secondary | ICD-10-CM | POA: Diagnosis not present

## 2021-07-07 DIAGNOSIS — E119 Type 2 diabetes mellitus without complications: Secondary | ICD-10-CM | POA: Diagnosis not present

## 2021-07-07 DIAGNOSIS — F0390 Unspecified dementia without behavioral disturbance: Secondary | ICD-10-CM | POA: Diagnosis not present

## 2021-07-07 DIAGNOSIS — Z7984 Long term (current) use of oral hypoglycemic drugs: Secondary | ICD-10-CM | POA: Diagnosis not present

## 2021-07-08 DIAGNOSIS — J189 Pneumonia, unspecified organism: Secondary | ICD-10-CM | POA: Diagnosis not present

## 2021-07-08 DIAGNOSIS — Z09 Encounter for follow-up examination after completed treatment for conditions other than malignant neoplasm: Secondary | ICD-10-CM | POA: Diagnosis not present

## 2021-07-11 DIAGNOSIS — Z20828 Contact with and (suspected) exposure to other viral communicable diseases: Secondary | ICD-10-CM | POA: Diagnosis not present

## 2021-07-11 DIAGNOSIS — Z7984 Long term (current) use of oral hypoglycemic drugs: Secondary | ICD-10-CM | POA: Diagnosis not present

## 2021-07-11 DIAGNOSIS — I4891 Unspecified atrial fibrillation: Secondary | ICD-10-CM | POA: Diagnosis not present

## 2021-07-11 DIAGNOSIS — E119 Type 2 diabetes mellitus without complications: Secondary | ICD-10-CM | POA: Diagnosis not present

## 2021-07-11 DIAGNOSIS — F0390 Unspecified dementia without behavioral disturbance: Secondary | ICD-10-CM | POA: Diagnosis not present

## 2021-07-11 DIAGNOSIS — I5021 Acute systolic (congestive) heart failure: Secondary | ICD-10-CM | POA: Diagnosis not present

## 2021-07-11 DIAGNOSIS — I110 Hypertensive heart disease with heart failure: Secondary | ICD-10-CM | POA: Diagnosis not present

## 2021-07-11 DIAGNOSIS — J9601 Acute respiratory failure with hypoxia: Secondary | ICD-10-CM | POA: Diagnosis not present

## 2021-07-14 DIAGNOSIS — I5021 Acute systolic (congestive) heart failure: Secondary | ICD-10-CM | POA: Diagnosis not present

## 2021-07-14 DIAGNOSIS — Z7984 Long term (current) use of oral hypoglycemic drugs: Secondary | ICD-10-CM | POA: Diagnosis not present

## 2021-07-14 DIAGNOSIS — I4891 Unspecified atrial fibrillation: Secondary | ICD-10-CM | POA: Diagnosis not present

## 2021-07-14 DIAGNOSIS — J9601 Acute respiratory failure with hypoxia: Secondary | ICD-10-CM | POA: Diagnosis not present

## 2021-07-14 DIAGNOSIS — F0390 Unspecified dementia without behavioral disturbance: Secondary | ICD-10-CM | POA: Diagnosis not present

## 2021-07-14 DIAGNOSIS — I110 Hypertensive heart disease with heart failure: Secondary | ICD-10-CM | POA: Diagnosis not present

## 2021-07-14 DIAGNOSIS — E119 Type 2 diabetes mellitus without complications: Secondary | ICD-10-CM | POA: Diagnosis not present

## 2021-07-15 DIAGNOSIS — I110 Hypertensive heart disease with heart failure: Secondary | ICD-10-CM | POA: Diagnosis not present

## 2021-07-15 DIAGNOSIS — Z7984 Long term (current) use of oral hypoglycemic drugs: Secondary | ICD-10-CM | POA: Diagnosis not present

## 2021-07-15 DIAGNOSIS — E119 Type 2 diabetes mellitus without complications: Secondary | ICD-10-CM | POA: Diagnosis not present

## 2021-07-15 DIAGNOSIS — J9601 Acute respiratory failure with hypoxia: Secondary | ICD-10-CM | POA: Diagnosis not present

## 2021-07-15 DIAGNOSIS — I4891 Unspecified atrial fibrillation: Secondary | ICD-10-CM | POA: Diagnosis not present

## 2021-07-15 DIAGNOSIS — F0390 Unspecified dementia without behavioral disturbance: Secondary | ICD-10-CM | POA: Diagnosis not present

## 2021-07-15 DIAGNOSIS — I5021 Acute systolic (congestive) heart failure: Secondary | ICD-10-CM | POA: Diagnosis not present

## 2021-07-18 DIAGNOSIS — I4891 Unspecified atrial fibrillation: Secondary | ICD-10-CM | POA: Diagnosis not present

## 2021-07-18 DIAGNOSIS — I5021 Acute systolic (congestive) heart failure: Secondary | ICD-10-CM | POA: Diagnosis not present

## 2021-07-18 DIAGNOSIS — I110 Hypertensive heart disease with heart failure: Secondary | ICD-10-CM | POA: Diagnosis not present

## 2021-07-18 DIAGNOSIS — J9601 Acute respiratory failure with hypoxia: Secondary | ICD-10-CM | POA: Diagnosis not present

## 2021-07-18 DIAGNOSIS — E119 Type 2 diabetes mellitus without complications: Secondary | ICD-10-CM | POA: Diagnosis not present

## 2021-07-18 DIAGNOSIS — F0390 Unspecified dementia without behavioral disturbance: Secondary | ICD-10-CM | POA: Diagnosis not present

## 2021-07-18 DIAGNOSIS — Z7984 Long term (current) use of oral hypoglycemic drugs: Secondary | ICD-10-CM | POA: Diagnosis not present

## 2021-07-20 DIAGNOSIS — E119 Type 2 diabetes mellitus without complications: Secondary | ICD-10-CM | POA: Diagnosis not present

## 2021-07-20 DIAGNOSIS — J9601 Acute respiratory failure with hypoxia: Secondary | ICD-10-CM | POA: Diagnosis not present

## 2021-07-20 DIAGNOSIS — F0390 Unspecified dementia without behavioral disturbance: Secondary | ICD-10-CM | POA: Diagnosis not present

## 2021-07-20 DIAGNOSIS — I4891 Unspecified atrial fibrillation: Secondary | ICD-10-CM | POA: Diagnosis not present

## 2021-07-20 DIAGNOSIS — Z7984 Long term (current) use of oral hypoglycemic drugs: Secondary | ICD-10-CM | POA: Diagnosis not present

## 2021-07-20 DIAGNOSIS — I5021 Acute systolic (congestive) heart failure: Secondary | ICD-10-CM | POA: Diagnosis not present

## 2021-07-20 DIAGNOSIS — I110 Hypertensive heart disease with heart failure: Secondary | ICD-10-CM | POA: Diagnosis not present

## 2021-07-22 DIAGNOSIS — J9601 Acute respiratory failure with hypoxia: Secondary | ICD-10-CM | POA: Diagnosis not present

## 2021-07-22 DIAGNOSIS — I5021 Acute systolic (congestive) heart failure: Secondary | ICD-10-CM | POA: Diagnosis not present

## 2021-07-22 DIAGNOSIS — F0390 Unspecified dementia without behavioral disturbance: Secondary | ICD-10-CM | POA: Diagnosis not present

## 2021-07-22 DIAGNOSIS — E119 Type 2 diabetes mellitus without complications: Secondary | ICD-10-CM | POA: Diagnosis not present

## 2021-07-22 DIAGNOSIS — Z7984 Long term (current) use of oral hypoglycemic drugs: Secondary | ICD-10-CM | POA: Diagnosis not present

## 2021-07-22 DIAGNOSIS — I4891 Unspecified atrial fibrillation: Secondary | ICD-10-CM | POA: Diagnosis not present

## 2021-07-22 DIAGNOSIS — I110 Hypertensive heart disease with heart failure: Secondary | ICD-10-CM | POA: Diagnosis not present

## 2021-07-27 DIAGNOSIS — I4891 Unspecified atrial fibrillation: Secondary | ICD-10-CM | POA: Diagnosis not present

## 2021-07-27 DIAGNOSIS — E119 Type 2 diabetes mellitus without complications: Secondary | ICD-10-CM | POA: Diagnosis not present

## 2021-07-27 DIAGNOSIS — Z7984 Long term (current) use of oral hypoglycemic drugs: Secondary | ICD-10-CM | POA: Diagnosis not present

## 2021-07-27 DIAGNOSIS — I5021 Acute systolic (congestive) heart failure: Secondary | ICD-10-CM | POA: Diagnosis not present

## 2021-07-27 DIAGNOSIS — F0390 Unspecified dementia without behavioral disturbance: Secondary | ICD-10-CM | POA: Diagnosis not present

## 2021-07-27 DIAGNOSIS — I110 Hypertensive heart disease with heart failure: Secondary | ICD-10-CM | POA: Diagnosis not present

## 2021-07-27 DIAGNOSIS — J9601 Acute respiratory failure with hypoxia: Secondary | ICD-10-CM | POA: Diagnosis not present

## 2021-07-27 DIAGNOSIS — N390 Urinary tract infection, site not specified: Secondary | ICD-10-CM | POA: Diagnosis not present

## 2021-07-28 DIAGNOSIS — I5021 Acute systolic (congestive) heart failure: Secondary | ICD-10-CM | POA: Diagnosis not present

## 2021-07-28 DIAGNOSIS — I110 Hypertensive heart disease with heart failure: Secondary | ICD-10-CM | POA: Diagnosis not present

## 2021-07-28 DIAGNOSIS — I5022 Chronic systolic (congestive) heart failure: Secondary | ICD-10-CM | POA: Diagnosis not present

## 2021-07-28 DIAGNOSIS — I4891 Unspecified atrial fibrillation: Secondary | ICD-10-CM | POA: Diagnosis not present

## 2021-07-28 DIAGNOSIS — J9601 Acute respiratory failure with hypoxia: Secondary | ICD-10-CM | POA: Diagnosis not present

## 2021-07-28 DIAGNOSIS — R54 Age-related physical debility: Secondary | ICD-10-CM | POA: Diagnosis not present

## 2021-07-28 DIAGNOSIS — F0390 Unspecified dementia without behavioral disturbance: Secondary | ICD-10-CM | POA: Diagnosis not present

## 2021-07-28 DIAGNOSIS — R600 Localized edema: Secondary | ICD-10-CM | POA: Diagnosis not present

## 2021-07-28 DIAGNOSIS — R4182 Altered mental status, unspecified: Secondary | ICD-10-CM | POA: Diagnosis not present

## 2021-07-28 DIAGNOSIS — Z7984 Long term (current) use of oral hypoglycemic drugs: Secondary | ICD-10-CM | POA: Diagnosis not present

## 2021-07-28 DIAGNOSIS — E119 Type 2 diabetes mellitus without complications: Secondary | ICD-10-CM | POA: Diagnosis not present

## 2021-07-29 DIAGNOSIS — Z7984 Long term (current) use of oral hypoglycemic drugs: Secondary | ICD-10-CM | POA: Diagnosis not present

## 2021-07-29 DIAGNOSIS — I110 Hypertensive heart disease with heart failure: Secondary | ICD-10-CM | POA: Diagnosis not present

## 2021-07-29 DIAGNOSIS — E119 Type 2 diabetes mellitus without complications: Secondary | ICD-10-CM | POA: Diagnosis not present

## 2021-07-29 DIAGNOSIS — I5021 Acute systolic (congestive) heart failure: Secondary | ICD-10-CM | POA: Diagnosis not present

## 2021-07-29 DIAGNOSIS — J9601 Acute respiratory failure with hypoxia: Secondary | ICD-10-CM | POA: Diagnosis not present

## 2021-07-29 DIAGNOSIS — I4891 Unspecified atrial fibrillation: Secondary | ICD-10-CM | POA: Diagnosis not present

## 2021-07-29 DIAGNOSIS — F0390 Unspecified dementia without behavioral disturbance: Secondary | ICD-10-CM | POA: Diagnosis not present

## 2021-08-01 DIAGNOSIS — F0390 Unspecified dementia without behavioral disturbance: Secondary | ICD-10-CM | POA: Diagnosis not present

## 2021-08-01 DIAGNOSIS — I5021 Acute systolic (congestive) heart failure: Secondary | ICD-10-CM | POA: Diagnosis not present

## 2021-08-01 DIAGNOSIS — J9601 Acute respiratory failure with hypoxia: Secondary | ICD-10-CM | POA: Diagnosis not present

## 2021-08-01 DIAGNOSIS — Z7984 Long term (current) use of oral hypoglycemic drugs: Secondary | ICD-10-CM | POA: Diagnosis not present

## 2021-08-01 DIAGNOSIS — I110 Hypertensive heart disease with heart failure: Secondary | ICD-10-CM | POA: Diagnosis not present

## 2021-08-01 DIAGNOSIS — E119 Type 2 diabetes mellitus without complications: Secondary | ICD-10-CM | POA: Diagnosis not present

## 2021-08-01 DIAGNOSIS — I4891 Unspecified atrial fibrillation: Secondary | ICD-10-CM | POA: Diagnosis not present

## 2021-08-02 DIAGNOSIS — I4891 Unspecified atrial fibrillation: Secondary | ICD-10-CM | POA: Diagnosis not present

## 2021-08-02 DIAGNOSIS — I5021 Acute systolic (congestive) heart failure: Secondary | ICD-10-CM | POA: Diagnosis not present

## 2021-08-02 DIAGNOSIS — I110 Hypertensive heart disease with heart failure: Secondary | ICD-10-CM | POA: Diagnosis not present

## 2021-08-02 DIAGNOSIS — Z7984 Long term (current) use of oral hypoglycemic drugs: Secondary | ICD-10-CM | POA: Diagnosis not present

## 2021-08-02 DIAGNOSIS — E119 Type 2 diabetes mellitus without complications: Secondary | ICD-10-CM | POA: Diagnosis not present

## 2021-08-02 DIAGNOSIS — F0390 Unspecified dementia without behavioral disturbance: Secondary | ICD-10-CM | POA: Diagnosis not present

## 2021-08-02 DIAGNOSIS — J9601 Acute respiratory failure with hypoxia: Secondary | ICD-10-CM | POA: Diagnosis not present

## 2021-08-03 DIAGNOSIS — F419 Anxiety disorder, unspecified: Secondary | ICD-10-CM | POA: Diagnosis not present

## 2021-08-03 DIAGNOSIS — F0390 Unspecified dementia without behavioral disturbance: Secondary | ICD-10-CM | POA: Diagnosis not present

## 2021-08-08 DIAGNOSIS — Z87440 Personal history of urinary (tract) infections: Secondary | ICD-10-CM | POA: Diagnosis not present

## 2021-08-08 DIAGNOSIS — R4182 Altered mental status, unspecified: Secondary | ICD-10-CM | POA: Diagnosis not present

## 2021-08-08 DIAGNOSIS — F0390 Unspecified dementia without behavioral disturbance: Secondary | ICD-10-CM | POA: Diagnosis not present

## 2021-08-08 DIAGNOSIS — B952 Enterococcus as the cause of diseases classified elsewhere: Secondary | ICD-10-CM | POA: Diagnosis not present

## 2021-08-08 DIAGNOSIS — N390 Urinary tract infection, site not specified: Secondary | ICD-10-CM | POA: Diagnosis not present

## 2021-08-09 DIAGNOSIS — F0390 Unspecified dementia without behavioral disturbance: Secondary | ICD-10-CM | POA: Diagnosis not present

## 2021-08-09 DIAGNOSIS — I110 Hypertensive heart disease with heart failure: Secondary | ICD-10-CM | POA: Diagnosis not present

## 2021-08-09 DIAGNOSIS — Z7984 Long term (current) use of oral hypoglycemic drugs: Secondary | ICD-10-CM | POA: Diagnosis not present

## 2021-08-09 DIAGNOSIS — I5021 Acute systolic (congestive) heart failure: Secondary | ICD-10-CM | POA: Diagnosis not present

## 2021-08-09 DIAGNOSIS — J9601 Acute respiratory failure with hypoxia: Secondary | ICD-10-CM | POA: Diagnosis not present

## 2021-08-09 DIAGNOSIS — I4891 Unspecified atrial fibrillation: Secondary | ICD-10-CM | POA: Diagnosis not present

## 2021-08-09 DIAGNOSIS — E119 Type 2 diabetes mellitus without complications: Secondary | ICD-10-CM | POA: Diagnosis not present

## 2021-08-10 DIAGNOSIS — R600 Localized edema: Secondary | ICD-10-CM | POA: Diagnosis not present

## 2021-08-10 DIAGNOSIS — I4891 Unspecified atrial fibrillation: Secondary | ICD-10-CM | POA: Diagnosis not present

## 2021-08-10 DIAGNOSIS — F0390 Unspecified dementia without behavioral disturbance: Secondary | ICD-10-CM | POA: Diagnosis not present

## 2021-08-10 DIAGNOSIS — I5032 Chronic diastolic (congestive) heart failure: Secondary | ICD-10-CM | POA: Diagnosis not present

## 2021-08-10 DIAGNOSIS — R54 Age-related physical debility: Secondary | ICD-10-CM | POA: Diagnosis not present

## 2021-08-11 DIAGNOSIS — R2242 Localized swelling, mass and lump, left lower limb: Secondary | ICD-10-CM | POA: Diagnosis not present

## 2021-08-12 DIAGNOSIS — J9601 Acute respiratory failure with hypoxia: Secondary | ICD-10-CM | POA: Diagnosis not present

## 2021-08-12 DIAGNOSIS — E119 Type 2 diabetes mellitus without complications: Secondary | ICD-10-CM | POA: Diagnosis not present

## 2021-08-12 DIAGNOSIS — I5021 Acute systolic (congestive) heart failure: Secondary | ICD-10-CM | POA: Diagnosis not present

## 2021-08-12 DIAGNOSIS — I4891 Unspecified atrial fibrillation: Secondary | ICD-10-CM | POA: Diagnosis not present

## 2021-08-12 DIAGNOSIS — I110 Hypertensive heart disease with heart failure: Secondary | ICD-10-CM | POA: Diagnosis not present

## 2021-08-12 DIAGNOSIS — Z7984 Long term (current) use of oral hypoglycemic drugs: Secondary | ICD-10-CM | POA: Diagnosis not present

## 2021-08-12 DIAGNOSIS — F0390 Unspecified dementia without behavioral disturbance: Secondary | ICD-10-CM | POA: Diagnosis not present

## 2021-08-15 DIAGNOSIS — F0390 Unspecified dementia without behavioral disturbance: Secondary | ICD-10-CM | POA: Diagnosis not present

## 2021-08-15 DIAGNOSIS — I110 Hypertensive heart disease with heart failure: Secondary | ICD-10-CM | POA: Diagnosis not present

## 2021-08-15 DIAGNOSIS — I5021 Acute systolic (congestive) heart failure: Secondary | ICD-10-CM | POA: Diagnosis not present

## 2021-08-15 DIAGNOSIS — J9601 Acute respiratory failure with hypoxia: Secondary | ICD-10-CM | POA: Diagnosis not present

## 2021-08-15 DIAGNOSIS — Z7984 Long term (current) use of oral hypoglycemic drugs: Secondary | ICD-10-CM | POA: Diagnosis not present

## 2021-08-15 DIAGNOSIS — I4891 Unspecified atrial fibrillation: Secondary | ICD-10-CM | POA: Diagnosis not present

## 2021-08-15 DIAGNOSIS — E119 Type 2 diabetes mellitus without complications: Secondary | ICD-10-CM | POA: Diagnosis not present

## 2021-08-16 DIAGNOSIS — F0390 Unspecified dementia without behavioral disturbance: Secondary | ICD-10-CM | POA: Diagnosis not present

## 2021-08-16 DIAGNOSIS — F419 Anxiety disorder, unspecified: Secondary | ICD-10-CM | POA: Diagnosis not present

## 2021-08-17 DIAGNOSIS — Z23 Encounter for immunization: Secondary | ICD-10-CM | POA: Diagnosis not present

## 2021-08-18 DIAGNOSIS — I110 Hypertensive heart disease with heart failure: Secondary | ICD-10-CM | POA: Diagnosis not present

## 2021-08-18 DIAGNOSIS — I4891 Unspecified atrial fibrillation: Secondary | ICD-10-CM | POA: Diagnosis not present

## 2021-08-18 DIAGNOSIS — E119 Type 2 diabetes mellitus without complications: Secondary | ICD-10-CM | POA: Diagnosis not present

## 2021-08-18 DIAGNOSIS — J9601 Acute respiratory failure with hypoxia: Secondary | ICD-10-CM | POA: Diagnosis not present

## 2021-08-18 DIAGNOSIS — F0390 Unspecified dementia without behavioral disturbance: Secondary | ICD-10-CM | POA: Diagnosis not present

## 2021-08-18 DIAGNOSIS — I5021 Acute systolic (congestive) heart failure: Secondary | ICD-10-CM | POA: Diagnosis not present

## 2021-08-18 DIAGNOSIS — Z7984 Long term (current) use of oral hypoglycemic drugs: Secondary | ICD-10-CM | POA: Diagnosis not present

## 2021-08-22 DIAGNOSIS — R54 Age-related physical debility: Secondary | ICD-10-CM | POA: Diagnosis not present

## 2021-08-22 DIAGNOSIS — R600 Localized edema: Secondary | ICD-10-CM | POA: Diagnosis not present

## 2021-08-22 DIAGNOSIS — I4891 Unspecified atrial fibrillation: Secondary | ICD-10-CM | POA: Diagnosis not present

## 2021-08-22 DIAGNOSIS — I110 Hypertensive heart disease with heart failure: Secondary | ICD-10-CM | POA: Diagnosis not present

## 2021-08-22 DIAGNOSIS — E119 Type 2 diabetes mellitus without complications: Secondary | ICD-10-CM | POA: Diagnosis not present

## 2021-08-22 DIAGNOSIS — I5021 Acute systolic (congestive) heart failure: Secondary | ICD-10-CM | POA: Diagnosis not present

## 2021-08-22 DIAGNOSIS — J9601 Acute respiratory failure with hypoxia: Secondary | ICD-10-CM | POA: Diagnosis not present

## 2021-08-22 DIAGNOSIS — I5022 Chronic systolic (congestive) heart failure: Secondary | ICD-10-CM | POA: Diagnosis not present

## 2021-08-22 DIAGNOSIS — F0390 Unspecified dementia without behavioral disturbance: Secondary | ICD-10-CM | POA: Diagnosis not present

## 2021-08-22 DIAGNOSIS — Z7984 Long term (current) use of oral hypoglycemic drugs: Secondary | ICD-10-CM | POA: Diagnosis not present

## 2021-08-23 DIAGNOSIS — F0390 Unspecified dementia without behavioral disturbance: Secondary | ICD-10-CM | POA: Diagnosis not present

## 2021-08-23 DIAGNOSIS — I4891 Unspecified atrial fibrillation: Secondary | ICD-10-CM | POA: Diagnosis not present

## 2021-08-23 DIAGNOSIS — J9601 Acute respiratory failure with hypoxia: Secondary | ICD-10-CM | POA: Diagnosis not present

## 2021-08-23 DIAGNOSIS — E119 Type 2 diabetes mellitus without complications: Secondary | ICD-10-CM | POA: Diagnosis not present

## 2021-08-23 DIAGNOSIS — Z7984 Long term (current) use of oral hypoglycemic drugs: Secondary | ICD-10-CM | POA: Diagnosis not present

## 2021-08-23 DIAGNOSIS — I5021 Acute systolic (congestive) heart failure: Secondary | ICD-10-CM | POA: Diagnosis not present

## 2021-08-23 DIAGNOSIS — I110 Hypertensive heart disease with heart failure: Secondary | ICD-10-CM | POA: Diagnosis not present

## 2021-08-29 DIAGNOSIS — F0390 Unspecified dementia without behavioral disturbance: Secondary | ICD-10-CM | POA: Diagnosis not present

## 2021-08-29 DIAGNOSIS — I110 Hypertensive heart disease with heart failure: Secondary | ICD-10-CM | POA: Diagnosis not present

## 2021-08-29 DIAGNOSIS — E119 Type 2 diabetes mellitus without complications: Secondary | ICD-10-CM | POA: Diagnosis not present

## 2021-08-29 DIAGNOSIS — I5021 Acute systolic (congestive) heart failure: Secondary | ICD-10-CM | POA: Diagnosis not present

## 2021-08-29 DIAGNOSIS — Z7984 Long term (current) use of oral hypoglycemic drugs: Secondary | ICD-10-CM | POA: Diagnosis not present

## 2021-08-29 DIAGNOSIS — I4891 Unspecified atrial fibrillation: Secondary | ICD-10-CM | POA: Diagnosis not present

## 2021-08-29 DIAGNOSIS — J9601 Acute respiratory failure with hypoxia: Secondary | ICD-10-CM | POA: Diagnosis not present

## 2021-08-30 DIAGNOSIS — I110 Hypertensive heart disease with heart failure: Secondary | ICD-10-CM | POA: Diagnosis not present

## 2021-08-30 DIAGNOSIS — E119 Type 2 diabetes mellitus without complications: Secondary | ICD-10-CM | POA: Diagnosis not present

## 2021-08-30 DIAGNOSIS — F0390 Unspecified dementia without behavioral disturbance: Secondary | ICD-10-CM | POA: Diagnosis not present

## 2021-08-30 DIAGNOSIS — Z7984 Long term (current) use of oral hypoglycemic drugs: Secondary | ICD-10-CM | POA: Diagnosis not present

## 2021-08-30 DIAGNOSIS — I5021 Acute systolic (congestive) heart failure: Secondary | ICD-10-CM | POA: Diagnosis not present

## 2021-08-30 DIAGNOSIS — J9601 Acute respiratory failure with hypoxia: Secondary | ICD-10-CM | POA: Diagnosis not present

## 2021-08-30 DIAGNOSIS — I4891 Unspecified atrial fibrillation: Secondary | ICD-10-CM | POA: Diagnosis not present

## 2021-09-06 DIAGNOSIS — B351 Tinea unguium: Secondary | ICD-10-CM | POA: Diagnosis not present

## 2021-09-06 DIAGNOSIS — L609 Nail disorder, unspecified: Secondary | ICD-10-CM | POA: Diagnosis not present

## 2021-09-07 DIAGNOSIS — F419 Anxiety disorder, unspecified: Secondary | ICD-10-CM | POA: Diagnosis not present

## 2021-09-07 DIAGNOSIS — F03918 Unspecified dementia, unspecified severity, with other behavioral disturbance: Secondary | ICD-10-CM | POA: Diagnosis not present

## 2021-09-15 DIAGNOSIS — F419 Anxiety disorder, unspecified: Secondary | ICD-10-CM | POA: Diagnosis not present

## 2021-10-05 DIAGNOSIS — F419 Anxiety disorder, unspecified: Secondary | ICD-10-CM | POA: Diagnosis not present

## 2021-10-05 DIAGNOSIS — F03918 Unspecified dementia, unspecified severity, with other behavioral disturbance: Secondary | ICD-10-CM | POA: Diagnosis not present

## 2021-10-11 DIAGNOSIS — F419 Anxiety disorder, unspecified: Secondary | ICD-10-CM | POA: Diagnosis not present

## 2021-10-11 DIAGNOSIS — F03918 Unspecified dementia, unspecified severity, with other behavioral disturbance: Secondary | ICD-10-CM | POA: Diagnosis not present

## 2021-10-24 DIAGNOSIS — F419 Anxiety disorder, unspecified: Secondary | ICD-10-CM | POA: Diagnosis not present

## 2021-11-02 DIAGNOSIS — F419 Anxiety disorder, unspecified: Secondary | ICD-10-CM | POA: Diagnosis not present

## 2021-11-02 DIAGNOSIS — F03918 Unspecified dementia, unspecified severity, with other behavioral disturbance: Secondary | ICD-10-CM | POA: Diagnosis not present

## 2021-11-22 DIAGNOSIS — F419 Anxiety disorder, unspecified: Secondary | ICD-10-CM | POA: Diagnosis not present

## 2021-12-13 DIAGNOSIS — M25551 Pain in right hip: Secondary | ICD-10-CM | POA: Diagnosis not present

## 2021-12-13 DIAGNOSIS — Z7982 Long term (current) use of aspirin: Secondary | ICD-10-CM | POA: Diagnosis not present

## 2021-12-13 DIAGNOSIS — T148XXA Other injury of unspecified body region, initial encounter: Secondary | ICD-10-CM | POA: Diagnosis not present

## 2021-12-13 DIAGNOSIS — I517 Cardiomegaly: Secondary | ICD-10-CM | POA: Diagnosis not present

## 2021-12-13 DIAGNOSIS — Z043 Encounter for examination and observation following other accident: Secondary | ICD-10-CM | POA: Diagnosis not present

## 2021-12-13 DIAGNOSIS — I110 Hypertensive heart disease with heart failure: Secondary | ICD-10-CM | POA: Diagnosis not present

## 2021-12-13 DIAGNOSIS — M160 Bilateral primary osteoarthritis of hip: Secondary | ICD-10-CM | POA: Diagnosis not present

## 2021-12-13 DIAGNOSIS — W19XXXA Unspecified fall, initial encounter: Secondary | ICD-10-CM | POA: Diagnosis not present

## 2021-12-13 DIAGNOSIS — M1611 Unilateral primary osteoarthritis, right hip: Secondary | ICD-10-CM | POA: Diagnosis not present

## 2021-12-13 DIAGNOSIS — M25561 Pain in right knee: Secondary | ICD-10-CM | POA: Diagnosis not present

## 2021-12-13 DIAGNOSIS — I509 Heart failure, unspecified: Secondary | ICD-10-CM | POA: Diagnosis not present

## 2021-12-13 DIAGNOSIS — I959 Hypotension, unspecified: Secondary | ICD-10-CM | POA: Diagnosis not present

## 2021-12-13 DIAGNOSIS — M25461 Effusion, right knee: Secondary | ICD-10-CM | POA: Diagnosis not present

## 2021-12-13 DIAGNOSIS — I10 Essential (primary) hypertension: Secondary | ICD-10-CM | POA: Diagnosis not present

## 2021-12-14 DIAGNOSIS — M160 Bilateral primary osteoarthritis of hip: Secondary | ICD-10-CM | POA: Diagnosis not present

## 2021-12-14 DIAGNOSIS — M25561 Pain in right knee: Secondary | ICD-10-CM | POA: Diagnosis not present

## 2021-12-14 DIAGNOSIS — M25551 Pain in right hip: Secondary | ICD-10-CM | POA: Diagnosis not present

## 2021-12-14 DIAGNOSIS — N390 Urinary tract infection, site not specified: Secondary | ICD-10-CM | POA: Diagnosis not present

## 2021-12-14 DIAGNOSIS — M25552 Pain in left hip: Secondary | ICD-10-CM | POA: Diagnosis not present

## 2021-12-14 DIAGNOSIS — M1711 Unilateral primary osteoarthritis, right knee: Secondary | ICD-10-CM | POA: Diagnosis not present

## 2021-12-14 DIAGNOSIS — W19XXXA Unspecified fall, initial encounter: Secondary | ICD-10-CM | POA: Diagnosis not present

## 2021-12-16 DIAGNOSIS — F419 Anxiety disorder, unspecified: Secondary | ICD-10-CM | POA: Diagnosis not present

## 2021-12-16 DIAGNOSIS — N390 Urinary tract infection, site not specified: Secondary | ICD-10-CM | POA: Diagnosis not present

## 2021-12-27 DIAGNOSIS — L609 Nail disorder, unspecified: Secondary | ICD-10-CM | POA: Diagnosis not present

## 2021-12-27 DIAGNOSIS — I10 Essential (primary) hypertension: Secondary | ICD-10-CM | POA: Diagnosis not present

## 2021-12-27 DIAGNOSIS — E119 Type 2 diabetes mellitus without complications: Secondary | ICD-10-CM | POA: Diagnosis not present

## 2021-12-27 DIAGNOSIS — B351 Tinea unguium: Secondary | ICD-10-CM | POA: Diagnosis not present

## 2021-12-27 DIAGNOSIS — D649 Anemia, unspecified: Secondary | ICD-10-CM | POA: Diagnosis not present

## 2022-01-03 DIAGNOSIS — F419 Anxiety disorder, unspecified: Secondary | ICD-10-CM | POA: Diagnosis not present

## 2022-01-17 DIAGNOSIS — R2242 Localized swelling, mass and lump, left lower limb: Secondary | ICD-10-CM | POA: Diagnosis not present

## 2022-01-17 DIAGNOSIS — R208 Other disturbances of skin sensation: Secondary | ICD-10-CM | POA: Diagnosis not present

## 2022-01-17 DIAGNOSIS — L539 Erythematous condition, unspecified: Secondary | ICD-10-CM | POA: Diagnosis not present

## 2022-01-19 DIAGNOSIS — M5134 Other intervertebral disc degeneration, thoracic region: Secondary | ICD-10-CM | POA: Diagnosis not present

## 2022-01-19 DIAGNOSIS — M5136 Other intervertebral disc degeneration, lumbar region: Secondary | ICD-10-CM | POA: Diagnosis not present

## 2022-01-19 DIAGNOSIS — M546 Pain in thoracic spine: Secondary | ICD-10-CM | POA: Diagnosis not present

## 2022-01-19 DIAGNOSIS — M545 Low back pain, unspecified: Secondary | ICD-10-CM | POA: Diagnosis not present

## 2022-01-19 DIAGNOSIS — N390 Urinary tract infection, site not specified: Secondary | ICD-10-CM | POA: Diagnosis not present

## 2022-01-22 DIAGNOSIS — M47814 Spondylosis without myelopathy or radiculopathy, thoracic region: Secondary | ICD-10-CM | POA: Diagnosis not present

## 2022-01-22 DIAGNOSIS — F0394 Unspecified dementia, unspecified severity, with anxiety: Secondary | ICD-10-CM | POA: Diagnosis not present

## 2022-01-22 DIAGNOSIS — G8929 Other chronic pain: Secondary | ICD-10-CM | POA: Diagnosis not present

## 2022-01-22 DIAGNOSIS — M47817 Spondylosis without myelopathy or radiculopathy, lumbosacral region: Secondary | ICD-10-CM | POA: Diagnosis not present

## 2022-05-18 DIAGNOSIS — E119 Type 2 diabetes mellitus without complications: Secondary | ICD-10-CM | POA: Diagnosis not present

## 2022-05-18 DIAGNOSIS — E785 Hyperlipidemia, unspecified: Secondary | ICD-10-CM | POA: Diagnosis not present

## 2022-05-18 DIAGNOSIS — E559 Vitamin D deficiency, unspecified: Secondary | ICD-10-CM | POA: Diagnosis not present

## 2022-05-18 DIAGNOSIS — D519 Vitamin B12 deficiency anemia, unspecified: Secondary | ICD-10-CM | POA: Diagnosis not present

## 2022-05-18 DIAGNOSIS — D649 Anemia, unspecified: Secondary | ICD-10-CM | POA: Diagnosis not present

## 2022-05-18 DIAGNOSIS — E039 Hypothyroidism, unspecified: Secondary | ICD-10-CM | POA: Diagnosis not present

## 2022-05-18 DIAGNOSIS — I10 Essential (primary) hypertension: Secondary | ICD-10-CM | POA: Diagnosis not present

## 2022-07-18 DIAGNOSIS — E559 Vitamin D deficiency, unspecified: Secondary | ICD-10-CM | POA: Diagnosis not present

## 2022-07-21 DIAGNOSIS — I672 Cerebral atherosclerosis: Secondary | ICD-10-CM | POA: Diagnosis not present

## 2022-07-21 DIAGNOSIS — S0181XA Laceration without foreign body of other part of head, initial encounter: Secondary | ICD-10-CM | POA: Diagnosis not present

## 2022-07-21 DIAGNOSIS — M4312 Spondylolisthesis, cervical region: Secondary | ICD-10-CM | POA: Diagnosis not present

## 2022-07-21 DIAGNOSIS — I6523 Occlusion and stenosis of bilateral carotid arteries: Secondary | ICD-10-CM | POA: Diagnosis not present

## 2022-07-21 DIAGNOSIS — N3090 Cystitis, unspecified without hematuria: Secondary | ICD-10-CM | POA: Diagnosis not present

## 2022-07-21 DIAGNOSIS — R001 Bradycardia, unspecified: Secondary | ICD-10-CM | POA: Diagnosis not present

## 2022-07-21 DIAGNOSIS — S0003XA Contusion of scalp, initial encounter: Secondary | ICD-10-CM | POA: Diagnosis not present

## 2022-07-21 DIAGNOSIS — S199XXA Unspecified injury of neck, initial encounter: Secondary | ICD-10-CM | POA: Diagnosis not present

## 2022-07-21 DIAGNOSIS — W19XXXA Unspecified fall, initial encounter: Secondary | ICD-10-CM | POA: Diagnosis not present

## 2022-07-21 DIAGNOSIS — I700 Atherosclerosis of aorta: Secondary | ICD-10-CM | POA: Diagnosis not present

## 2022-08-31 DIAGNOSIS — Z23 Encounter for immunization: Secondary | ICD-10-CM | POA: Diagnosis not present

## 2022-09-16 DIAGNOSIS — N390 Urinary tract infection, site not specified: Secondary | ICD-10-CM | POA: Diagnosis not present

## 2022-09-18 DIAGNOSIS — I639 Cerebral infarction, unspecified: Secondary | ICD-10-CM | POA: Diagnosis not present

## 2022-09-18 DIAGNOSIS — J3489 Other specified disorders of nose and nasal sinuses: Secondary | ICD-10-CM | POA: Diagnosis not present

## 2022-09-18 DIAGNOSIS — F03B0 Unspecified dementia, moderate, without behavioral disturbance, psychotic disturbance, mood disturbance, and anxiety: Secondary | ICD-10-CM | POA: Diagnosis not present

## 2022-09-18 DIAGNOSIS — Z7984 Long term (current) use of oral hypoglycemic drugs: Secondary | ICD-10-CM | POA: Diagnosis not present

## 2022-09-18 DIAGNOSIS — I10 Essential (primary) hypertension: Secondary | ICD-10-CM | POA: Diagnosis not present

## 2022-09-18 DIAGNOSIS — R4182 Altered mental status, unspecified: Secondary | ICD-10-CM | POA: Diagnosis not present

## 2022-09-18 DIAGNOSIS — G319 Degenerative disease of nervous system, unspecified: Secondary | ICD-10-CM | POA: Diagnosis not present

## 2022-09-18 DIAGNOSIS — S0990XA Unspecified injury of head, initial encounter: Secondary | ICD-10-CM | POA: Diagnosis not present

## 2022-09-18 DIAGNOSIS — G936 Cerebral edema: Secondary | ICD-10-CM | POA: Diagnosis not present

## 2022-09-18 DIAGNOSIS — Z79899 Other long term (current) drug therapy: Secondary | ICD-10-CM | POA: Diagnosis not present

## 2022-09-18 DIAGNOSIS — Z66 Do not resuscitate: Secondary | ICD-10-CM | POA: Diagnosis not present

## 2022-09-18 DIAGNOSIS — R531 Weakness: Secondary | ICD-10-CM | POA: Diagnosis not present

## 2022-09-18 DIAGNOSIS — G9389 Other specified disorders of brain: Secondary | ICD-10-CM | POA: Diagnosis not present

## 2022-09-18 DIAGNOSIS — I491 Atrial premature depolarization: Secondary | ICD-10-CM | POA: Diagnosis not present

## 2022-09-19 DIAGNOSIS — R531 Weakness: Secondary | ICD-10-CM | POA: Diagnosis not present

## 2022-09-19 DIAGNOSIS — I361 Nonrheumatic tricuspid (valve) insufficiency: Secondary | ICD-10-CM | POA: Diagnosis not present

## 2022-09-19 DIAGNOSIS — G936 Cerebral edema: Secondary | ICD-10-CM | POA: Diagnosis not present

## 2022-09-19 DIAGNOSIS — I340 Nonrheumatic mitral (valve) insufficiency: Secondary | ICD-10-CM | POA: Diagnosis not present

## 2022-09-19 DIAGNOSIS — I639 Cerebral infarction, unspecified: Secondary | ICD-10-CM | POA: Diagnosis not present

## 2022-09-19 DIAGNOSIS — R296 Repeated falls: Secondary | ICD-10-CM | POA: Diagnosis not present

## 2022-09-19 DIAGNOSIS — I63331 Cerebral infarction due to thrombosis of right posterior cerebral artery: Secondary | ICD-10-CM | POA: Diagnosis not present

## 2022-09-20 DIAGNOSIS — Z7401 Bed confinement status: Secondary | ICD-10-CM | POA: Diagnosis not present

## 2022-09-20 DIAGNOSIS — R5381 Other malaise: Secondary | ICD-10-CM | POA: Diagnosis not present

## 2022-09-20 DIAGNOSIS — R531 Weakness: Secondary | ICD-10-CM | POA: Diagnosis not present

## 2022-10-27 DEATH — deceased

## 2022-11-03 ENCOUNTER — Telehealth: Payer: Self-pay

## 2022-11-03 NOTE — Patient Outreach (Signed)
  Care Coordination   11/03/2022 Name: Sabrina [name] MRN: [id number] DOB: 1930/07/14   Care Coordination Outreach Attempts:  An unsuccessful telephone outreach was attempted today to offer the patient information about available care coordination services as a benefit of their health plan.   Follow Up Plan:  Additional outreach attempts will be made to offer the patient care coordination information and services.   Encounter Outcome:  No Answer   Care Coordination Interventions:  No, not indicated    Rowe Pavy, RN, BSN, [facility] [phone]

## 2022-12-28 ENCOUNTER — Telehealth: Payer: Self-pay

## 2022-12-28 NOTE — Patient Outreach (Signed)
  Care Coordination   Initial Visit Note   12/28/2022 Name: Sabrina [name] MRN: [id number] DOB: November 24, 1930  Sabrina [name] is a 89 y.o. year old female who sees Dough, Jaymes Graff, MD for primary care. I  placed call to patient and daughter answered.   What matters to the patients health and wellness today?  Daughter reports patient is deceased as of 10-20-22.  Will notify [employer] via Surveyor, quantity to update EMR.      SDOH assessments and interventions completed:  No     Care Coordination Interventions:  No, not indicated   Follow up plan: No further intervention required.   Encounter Outcome:  Pt. Visit Completed   Tomasa Rand, RN, BSN, CEN [place] Coordinator [phone]
